# Patient Record
Sex: Female | Born: 1950 | Race: Black or African American | Hispanic: No | Marital: Single | Smoking: Never smoker
Health system: Southern US, Community
[De-identification: ages and names within clinical notes are randomized; demographics above are authoritative.]

## PROBLEM LIST (undated history)

## (undated) DIAGNOSIS — E7800 Pure hypercholesterolemia, unspecified: Secondary | ICD-10-CM

## (undated) DIAGNOSIS — I10 Essential (primary) hypertension: Secondary | ICD-10-CM

## (undated) DIAGNOSIS — E119 Type 2 diabetes mellitus without complications: Secondary | ICD-10-CM

## (undated) DIAGNOSIS — N289 Disorder of kidney and ureter, unspecified: Secondary | ICD-10-CM

## (undated) HISTORY — PX: TUBAL LIGATION: SHX77

---

## 2004-03-25 ENCOUNTER — Emergency Department: Payer: Self-pay | Admitting: Unknown Physician Specialty

## 2005-03-16 ENCOUNTER — Emergency Department: Payer: Self-pay | Admitting: General Practice

## 2006-02-10 ENCOUNTER — Emergency Department: Payer: Self-pay | Admitting: General Practice

## 2007-09-18 ENCOUNTER — Emergency Department: Payer: Self-pay | Admitting: Emergency Medicine

## 2008-09-07 ENCOUNTER — Emergency Department: Payer: Self-pay | Admitting: Emergency Medicine

## 2009-01-25 ENCOUNTER — Emergency Department: Payer: Self-pay | Admitting: Emergency Medicine

## 2010-07-22 ENCOUNTER — Emergency Department: Payer: Self-pay | Admitting: Emergency Medicine

## 2013-11-20 ENCOUNTER — Emergency Department: Payer: Self-pay | Admitting: Emergency Medicine

## 2014-11-30 ENCOUNTER — Emergency Department
Admission: EM | Admit: 2014-11-30 | Discharge: 2014-11-30 | Disposition: A | Discharge status: Home / Self Care | Payer: BC Managed Care – PPO | Attending: Emergency Medicine | Admitting: Emergency Medicine

## 2014-11-30 ENCOUNTER — Encounter: Payer: Self-pay | Admitting: Emergency Medicine

## 2014-11-30 ENCOUNTER — Emergency Department: Payer: BC Managed Care – PPO

## 2014-11-30 DIAGNOSIS — N3091 Cystitis, unspecified with hematuria: Secondary | ICD-10-CM | POA: Insufficient documentation

## 2014-11-30 DIAGNOSIS — R1030 Lower abdominal pain, unspecified: Secondary | ICD-10-CM | POA: Diagnosis present

## 2014-11-30 DIAGNOSIS — R319 Hematuria, unspecified: Secondary | ICD-10-CM

## 2014-11-30 HISTORY — DX: Pure hypercholesterolemia, unspecified: E78.00

## 2014-11-30 LAB — URINALYSIS COMPLETE WITH MICROSCOPIC (ARMC ONLY)
BILIRUBIN URINE: NEGATIVE
Glucose, UA: NEGATIVE mg/dL
KETONES UR: NEGATIVE mg/dL
LEUKOCYTES UA: NEGATIVE
NITRITE: NEGATIVE
PH: 7 (ref 5.0–8.0)
Protein, ur: NEGATIVE mg/dL
SPECIFIC GRAVITY, URINE: 1.006 (ref 1.005–1.030)

## 2014-11-30 LAB — CBC WITH DIFFERENTIAL/PLATELET
BASOS PCT: 1 %
Basophils Absolute: 0 10*3/uL (ref 0–0.1)
EOS ABS: 0.1 10*3/uL (ref 0–0.7)
EOS PCT: 2 %
HCT: 39.4 % (ref 35.0–47.0)
Hemoglobin: 13.2 g/dL (ref 12.0–16.0)
LYMPHS ABS: 1.4 10*3/uL (ref 1.0–3.6)
Lymphocytes Relative: 27 %
MCH: 30.1 pg (ref 26.0–34.0)
MCHC: 33.4 g/dL (ref 32.0–36.0)
MCV: 90.1 fL (ref 80.0–100.0)
MONO ABS: 0.5 10*3/uL (ref 0.2–0.9)
MONOS PCT: 10 %
Neutro Abs: 3 10*3/uL (ref 1.4–6.5)
Neutrophils Relative %: 60 %
Platelets: 244 10*3/uL (ref 150–440)
RBC: 4.38 MIL/uL (ref 3.80–5.20)
RDW: 13.9 % (ref 11.5–14.5)
WBC: 5 10*3/uL (ref 3.6–11.0)

## 2014-11-30 LAB — BASIC METABOLIC PANEL
Anion gap: 7 (ref 5–15)
BUN: 11 mg/dL (ref 6–20)
CALCIUM: 9.5 mg/dL (ref 8.9–10.3)
CHLORIDE: 106 mmol/L (ref 101–111)
CO2: 27 mmol/L (ref 22–32)
CREATININE: 0.79 mg/dL (ref 0.44–1.00)
GFR calc non Af Amer: >60 mL/min (ref >60)
GLUCOSE: 96 mg/dL (ref 65–99)
Potassium: 3.6 mmol/L (ref 3.5–5.1)
Sodium: 140 mmol/L (ref 135–145)

## 2014-11-30 MED ORDER — NITROFURANTOIN MONOHYD MACRO 100 MG PO CAPS: 100.0000 mg | ORAL_CAPSULE | Freq: Two times a day (BID) | ORAL | Status: AC

## 2014-11-30 NOTE — ED Provider Notes (Signed)
Time Seen: Approximately 1600  I have reviewed the triage notes  Chief Complaint: Abdominal Cramping   History of Present Illness: Carolyn Clark is a 64 y.o. female who presents with some lower abdominal cramping. She states the cramping and lower abdominal pain started late Friday and then gradually started radiating into both flank areas. Right side is worse than the left. She noticed some blood in her urine and does have some urinary frequency. She had some brief nausea but no consistent symptoms and denies any vomiting. Denies any melena or hematochezia is currently not on any anticoagulation therapy.   Past Medical History  Diagnosis Date  . Hypercholesteremia     There are no active problems to display for this patient.   Past Surgical History  Procedure Laterality Date  . Tubal ligation      Past Surgical History  Procedure Laterality Date  . Tubal ligation      Current Outpatient Rx  Name  Route  Sig  Dispense  Refill  . nitrofurantoin, macrocrystal-monohydrate, (MACROBID) 100 MG capsule   Oral   Take 1 capsule (100 mg total) by mouth 2 (two) times daily.   14 capsule   0     Allergies:  Review of patient's allergies indicates no known allergies.  Family History: No family history on file.  Social History: Social History  Substance Use Topics  . Smoking status: Never Smoker   . Smokeless tobacco: None  . Alcohol Use: No     Review of Systems:   10 point review of systems was performed and was otherwise negative:  Constitutional: No fever Eyes: No visual disturbances ENT: No sore throat, ear pain Cardiac: No chest pain Respiratory: No shortness of breath, wheezing, or stridor Abdomen: No abdominal pain, no vomiting, No diarrhea Endocrine: No weight loss, No night sweats Extremities: No peripheral edema, cyanosis Skin: No rashes, easy bruising Neurologic: No focal weakness, trouble with speech or swollowing Urologic: Mild burning with  urination and some mild hematuria.  Physical Exam:  ED Triage Vitals  Enc Vitals Group     BP 11/30/14 1351 170/81 mmHg     Pulse Rate 11/30/14 1351 82     Resp 11/30/14 1351 20     Temp 11/30/14 1351 98 F (36.7 C)     Temp Source 11/30/14 1351 Oral     SpO2 11/30/14 1351 98 %     Weight 11/30/14 1351 160 lb (72.576 kg)     Height 11/30/14 1351  (1.626 m)     Head Cir --      Peak Flow --      Pain Score 11/30/14 1353 8     Pain Loc --      Pain Edu? --      Excl. in GC? --     General: Awake , Alert , and Oriented times 3; GCS 15 Head: Normal cephalic , atraumatic Eyes: Pupils equal , round, reactive to light Nose/Throat: No nasal drainage, patent upper airway without erythema or exudate.  Neck: Supple, Full range of motion, No anterior adenopathy or palpable thyroid masses Lungs: Clear to ascultation without wheezes , rhonchi, or rales Heart: Regular rate, regular rhythm without murmurs , gallops , or rubs Abdomen: Soft, non tender without rebound, guarding , or rigidity; bowel sounds positive and symmetric in all 4 quadrants. No organomegaly .        Extremities: 2 plus symmetric pulses. No edema, clubbing or cyanosis Neurologic: normal ambulation, Motor  symmetric without deficits, sensory intact Skin: warm, dry, no rashes   Labs:   All laboratory work was reviewed including any pertinent negatives or positives listed below:  Labs Reviewed  URINALYSIS COMPLETEWITH MICROSCOPIC ([facility] ONLY) - Abnormal; Notable for the following:    Color, Urine STRAW (*)    APPearance CLEAR (*)    Hgb urine dipstick 3+ (*)    Bacteria, UA RARE (*)    Squamous Epithelial / LPF 0-5 (*)    All other components within normal limits  URINE CULTURE  BASIC METABOLIC PANEL  CBC WITH DIFFERENTIAL/PLATELET   review of laboratory work is significant for blood in urine. Urine culture is pending   Radiology:  EXAM: CT ABDOMEN AND PELVIS WITHOUT CONTRAST  TECHNIQUE: Multidetector CT  imaging of the abdomen and pelvis was performed following the standard protocol without IV contrast.  COMPARISON: None.  FINDINGS: The lung bases are clear. No pleural or pericardial effusion.  A 0.8 cm in diameter nonobstructing stone is identified in the upper pole of the right kidney. No other urinary tract stones are present. There is no hydronephrosis on the right or left. Focus of low attenuation in the upper pole of the right kidney is compatible with a cyst. The kidneys are otherwise unremarkable. Uterus, adnexa and urinary bladder appear normal.  The gallbladder, spleen, adrenal glands and pancreas appear normal. A few small scattered low attenuating lesions in the liver are most consistent with cysts. Fat containing umbilical hernia is identified. The stomach, small and large bowel and appendix appear normal.  No lytic or sclerotic bony lesion is identified. Lower lumbar facet arthropathy is seen.  IMPRESSION: 0.7 cm nonobstructing stone upper pole right kidney. Negative for hydronephrosis or ureteral stone. No acute abnormality.   I personally reviewed the radiologic studies      ED Course:  Patient's stay here was uneventful and I felt her differential likely bubble down to cystitis, urinary tract infection, renal colic. She does have a small kidney stone on her CAT scan does not appear to be causing any symptoms at this point. Patient is afebrile and has a urine culture pending and I felt we could discharge her on oral antibiotic therapy. The pressure was elevated during today's visit which I thought may be cysts situational hypertension and she was advised to follow up with her primary physician.    Assessment:  Hemorrhagic cystitis   Final Clinical Impression: Final diagnoses:  Hematuria     Plan:  Outpatient management Patient was advised to return immediately if condition worsens. Patient was advised to follow up with her primary care physician  or other specialized physicians involved and in their current assessment.            Jennye Moccasin, MD 11/30/14 [phone]

## 2014-11-30 NOTE — ED Notes (Signed)
Lower abd cramping yesterday. Then noticed lower abck pain with blood in urine this am

## 2014-11-30 NOTE — Discharge Instructions (Signed)
Hematuria Hematuria is blood in your urine. It can be caused by a bladder infection, kidney infection, prostate infection, kidney stone, or cancer of your urinary tract. Infections can usually be treated with medicine, and a kidney stone usually will pass through your urine. If neither of these is the cause of your hematuria, further workup to find out the reason may be needed. It is very important that you tell your health care provider about any blood you see in your urine, even if the blood stops without treatment or happens without causing pain. Blood in your urine that happens and then stops and then happens again can be a symptom of a very serious condition. Also, pain is not a symptom in the initial stages of many urinary cancers. HOME CARE INSTRUCTIONS   Drink lots of fluid, 3-4 quarts a day. If you have been diagnosed with an infection, cranberry juice is especially recommended, in addition to large amounts of water.  Avoid caffeine, tea, and carbonated beverages because they tend to irritate the bladder.  Avoid alcohol because it may irritate the prostate.  Take all medicines as directed by your health care provider.  If you were prescribed an antibiotic medicine, finish it all even if you start to feel better.  If you have been diagnosed with a kidney stone, follow your health care provider's instructions regarding straining your urine to catch the stone.  Empty your bladder often. Avoid holding urine for long periods of time.  After a bowel movement, women should cleanse front to back. Use each tissue only once.  Empty your bladder before and after sexual intercourse if you are a female. SEEK MEDICAL CARE IF:  You develop back pain.  You have a fever.  You have a feeling of sickness in your stomach (nausea) or vomiting.  Your symptoms are not better in 3 days. Return sooner if you are getting worse. SEEK IMMEDIATE MEDICAL CARE IF:   You develop severe vomiting and are  unable to keep the medicine down.  You develop severe back or abdominal pain despite taking your medicines.  You begin passing a large amount of blood or clots in your urine.  You feel extremely weak or faint, or you pass out. MAKE SURE YOU:   Understand these instructions.  Will watch your condition.  Will get help right away if you are not doing well or get worse. Document Released: 02/21/2005 Document Revised: 07/08/2013 Document Reviewed: 10/22/2012 [facility] Patient Information 2015 [place], [place]. This information is not intended to replace advice given to you by your health care provider. Make sure you discuss any questions you have with your health care provider.  Please return immediately if condition worsens. Please contact her primary physician or the physician you were given for referral. If you have any specialist physicians involved in her treatment and plan please also contact them. Thank you for using [facility]. Return especially for fever, persistent vomiting, or any other new concerns.

## 2014-11-30 NOTE — ED Notes (Signed)
Patient transported to CT 

## 2015-11-24 ENCOUNTER — Encounter: Payer: Self-pay | Admitting: Emergency Medicine

## 2015-11-24 DIAGNOSIS — Y939 Activity, unspecified: Secondary | ICD-10-CM | POA: Insufficient documentation

## 2015-11-24 DIAGNOSIS — X58XXXA Exposure to other specified factors, initial encounter: Secondary | ICD-10-CM | POA: Diagnosis not present

## 2015-11-24 DIAGNOSIS — S46912A Strain of unspecified muscle, fascia and tendon at shoulder and upper arm level, left arm, initial encounter: Secondary | ICD-10-CM | POA: Insufficient documentation

## 2015-11-24 DIAGNOSIS — R079 Chest pain, unspecified: Secondary | ICD-10-CM | POA: Insufficient documentation

## 2015-11-24 DIAGNOSIS — Y999 Unspecified external cause status: Secondary | ICD-10-CM | POA: Diagnosis not present

## 2015-11-24 DIAGNOSIS — Y929 Unspecified place or not applicable: Secondary | ICD-10-CM | POA: Diagnosis not present

## 2015-11-24 DIAGNOSIS — M79602 Pain in left arm: Secondary | ICD-10-CM | POA: Diagnosis present

## 2015-11-24 LAB — CBC
HEMATOCRIT: 39.6 % (ref 35.0–47.0)
HEMOGLOBIN: 13.4 g/dL (ref 12.0–16.0)
MCH: 30.1 pg (ref 26.0–34.0)
MCHC: 33.8 g/dL (ref 32.0–36.0)
MCV: 89.2 fL (ref 80.0–100.0)
Platelets: 251 10*3/uL (ref 150–440)
RBC: 4.44 MIL/uL (ref 3.80–5.20)
RDW: 14.2 % (ref 11.5–14.5)
WBC: 4.7 10*3/uL (ref 3.6–11.0)

## 2015-11-24 LAB — BASIC METABOLIC PANEL
ANION GAP: 8 (ref 5–15)
BUN: 14 mg/dL (ref 6–20)
CO2: 25 mmol/L (ref 22–32)
Calcium: 9.7 mg/dL (ref 8.9–10.3)
Chloride: 106 mmol/L (ref 101–111)
Creatinine, Ser: 0.86 mg/dL (ref 0.44–1.00)
GFR calc Af Amer: >60 mL/min (ref >60)
GLUCOSE: 108 mg/dL — AB (ref 65–99)
POTASSIUM: 3.6 mmol/L (ref 3.5–5.1)
SODIUM: 139 mmol/L (ref 135–145)

## 2015-11-24 LAB — TROPONIN I

## 2015-11-24 NOTE — ED Triage Notes (Addendum)
Patient ambulatory to triage with steady gait, without difficulty or distress noted; pt reports pain to left arm today that increases with movement; pt denies any specific injury but is indicates area of left upper chest; denies any accomp symptoms but admits to having indigestion earlier today

## 2015-11-25 ENCOUNTER — Emergency Department: Payer: BC Managed Care – PPO

## 2015-11-25 ENCOUNTER — Emergency Department
Admission: EM | Admit: 2015-11-25 | Discharge: 2015-11-25 | Disposition: A | Discharge status: Home / Self Care | Payer: BC Managed Care – PPO | Attending: Student | Admitting: Student

## 2015-11-25 DIAGNOSIS — S46912A Strain of unspecified muscle, fascia and tendon at shoulder and upper arm level, left arm, initial encounter: Secondary | ICD-10-CM

## 2015-11-25 MED ORDER — KETOROLAC TROMETHAMINE 30 MG/ML IJ SOLN
15.0000 mg | Freq: Once | INTRAMUSCULAR | Status: AC
  Administered 2015-11-25: 15 mg via INTRAMUSCULAR
  Filled 2015-11-25: qty 1

## 2015-11-25 MED ORDER — NAPROXEN 500 MG PO TABS: 500.0000 mg | ORAL_TABLET | Freq: Two times a day (BID) | ORAL | 0 refills | Status: AC

## 2015-11-25 MED ORDER — CYCLOBENZAPRINE HCL 10 MG PO TABS
5.0000 mg | ORAL_TABLET | Freq: Once | ORAL | Status: AC
Start: 2015-11-25 — End: 2015-11-25
  Administered 2015-11-25: 5 mg via ORAL
  Filled 2015-11-25: qty 1

## 2015-11-25 MED ORDER — CYCLOBENZAPRINE HCL 5 MG PO TABS: 5.0000 mg | ORAL_TABLET | Freq: Three times a day (TID) | ORAL | 0 refills | Status: AC | PRN

## 2015-11-25 NOTE — ED Notes (Signed)
Pt discharged to home.  Family member driving.  Discharge instructions reviewed.  Verbalized understanding.  No questions or concerns at this time.  Teach back verified.  Pt in NAD.  No items left in ED.   

## 2015-11-25 NOTE — ED Provider Notes (Signed)
[facility] Emergency Department Provider Note   ____________________________________________   First MD Initiated Contact with Patient 11/25/15 [phone]     (approximate)  I have reviewed the triage vital signs and the nursing notes.   HISTORY  Chief Complaint Arm Pain    HPI PANAGIOTA PERFETTI is a 65 y.o. female with history of hypercholesterolemia who presents for evaluation of left shoulder pain which became severe at approximately 5 or 6 PM this evening, gradual onset, constant, moderate, worse with movement. Patient reports that throughout the day today she has had some pain in the left shoulder worse with movement, and has been constant but has been constant and severe since 5 or 6 PM. Pain does radiate into the left upper chest just adjacent to the shoulder. Pain is not worse with exertion or deep inspiration, not associated with any shortness of breath, nausea, sudden sweating, does not radiate to the back or down towards the feet, is not ripping or tearing in nature. 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 She had some mild indigestion earlier today which she reports is not uncommon. She denies any history of coronary artery disease, PE, aortic dissection or aortic aneurysm.   Past Medical History:  Diagnosis Date  . Hypercholesteremia     There are no active problems to display for this patient.   Past Surgical History:  Procedure Laterality Date  . TUBAL LIGATION      Prior to Admission medications   Not on File    Allergies Review of patient's allergies indicates no known allergies.  No family history on file.  Social History Social History  Substance Use Topics  . Smoking status: Never Smoker  . Smokeless tobacco: Never Used  . Alcohol use No    Review of Systems Constitutional: No fever/chills Eyes: No visual changes. ENT: No sore  throat. Cardiovascular: + chest pain. Respiratory: Denies shortness of breath. Gastrointestinal: No abdominal pain.  No nausea, no vomiting.  No diarrhea.  No constipation. Genitourinary: Negative for dysuria. Musculoskeletal: Negative for back pain. Skin: Negative for rash. Neurological: Negative for headaches, focal weakness or numbness.  10-point ROS otherwise negative.  ____________________________________________   PHYSICAL EXAM:  VITAL SIGNS: ED Triage Vitals  Enc Vitals Group     BP 11/24/15 2225 (!) 144/71     Pulse Rate 11/24/15 2225 80     Resp 11/24/15 2225 18     Temp 11/24/15 2225 97.8 F (36.6 C)     Temp Source 11/24/15 2225 Oral     SpO2 11/24/15 2225 99 %     Weight 11/24/15 2225 160 lb (72.6 kg)     Height 11/24/15 2225 5\' 4"  (1.626 m)     Head Circumference --      Peak Flow --      Pain Score 11/24/15 2229 7     Pain Loc --      Pain Edu? --      Excl. in GC? --     Constitutional: Alert and oriented. Well appearing and in no acute distress. Eyes: Conjunctivae are normal. PERRL. EOMI. Head: Atraumatic. Nose: No congestion/rhinnorhea. Mouth/Throat: Mucous membranes are moist.  Oropharynx non-erythematous. Neck: No stridor. Supple without meningismus. Cardiovascular: Normal rate, regular rhythm. Grossly normal heart sounds.  Good peripheral circulation. Respiratory: Normal respiratory effort.  No retractions. Lungs CTAB. Gastrointestinal: Soft and nontender. No distention. No CVA tenderness.  Genitourinary: deferred Musculoskeletal: No lower extremity tenderness nor edema.  No joint effusions. Tenderness to palpation throughout the left anterior shoulder without bony step-off or deformity, full although painful range of motion in the left shoulder.No swelling throughout the left arm. 2+ left radial pulse, left radial median and ulnar nerve are intact. Neurologic:  Normal speech and language. No gross focal neurologic deficits are appreciated. No gait  instability. Skin:  Skin is warm, dry and intact. No rash noted. Psychiatric: Mood and affect are normal. Speech and behavior are normal.  ____________________________________________   LABS (all labs ordered are listed, but only abnormal results are displayed)  Labs Reviewed  BASIC METABOLIC PANEL - Abnormal; Notable for the following:       Result Value   Glucose, Bld 108 (*)    All other components within normal limits  CBC  TROPONIN I   ____________________________________________  EKG  ED ECG REPORT I, Gayla Doss, the attending physician, personally viewed and interpreted this ECG.   Date: 11/25/2015  EKG Time: 22:38  Rate: 79  Rhythm: normal sinus rhythm  Axis: normal  Intervals:none  ST&T Change: No acute ST elevation or acute ST depression. T-wave inversions in lead V4, V5, V6.  ____________________________________________  RADIOLOGY  CXR IMPRESSION:  No acute cardiopulmonary process seen.       Left shoulder xray IMPRESSION:  No evidence of fracture or dislocation.     ____________________________________________   PROCEDURES  Procedure(s) performed: None  Procedures  Critical Care performed: No  ____________________________________________   INITIAL IMPRESSION / ASSESSMENT AND PLAN / ED COURSE  Pertinent labs & imaging results that were available during my care of the patient were reviewed by me and considered in my medical decision making (see chart for details).  NAUTIKA CRESSEY is a 65 y.o. female with history of hypercholesterolemia who presents for evaluation of left shoulder pain which became severe at approximately 5 or 6 PM this evening that has radiated into the adjacent left chest. On exam, she is very well-appearing and in no acute distress. Vital signs are stable and she is afebrile. She has reproducible point tenderness without bony deformity in the left shoulder and I suspect she has a shoulder strain. She is  neurovascularly intact in the left arm. I doubt that this represents ACS. Suspect the pain in her chest is actually referred pain from the shoulder. Her EKG shows no evidence of acute ischemia. There are T-wave inversions in V4, V5 and V6 however no prior EKGs available for comparison/to establish chronicity. Troponin, drawn more than 4.5 hours after the onset of her pain, which has been constant, is negative and I do not think this represents atypical ACS. Clinical picture is not consistent with PE or acute aortic dissection. CBC and BMP unremarkable. We'll treat her pain, obtain screening chest x-ray and left shoulder film but if unremarkable, I anticipate that she can be discharged with anti-inflammatory medications and muscle relaxant.  ----------------------------------------- 2:23 AM on 11/25/2015 ----------------------------------------- Plain film negative. Patient continues to rest comfortably. We discussed meticulous return precautions and need for close PCP follow-up and she is comfortable with the discharge plan. DC home.     Clinical Course     ____________________________________________   FINAL CLINICAL IMPRESSION(S) / ED DIAGNOSES  Final diagnoses:  Left shoulder strain, initial encounter      NEW MEDICATIONS STARTED DURING THIS VISIT:  New Prescriptions   No medications on file     Note:  This document was prepared using  [employer] engine[employer]oice recognition software and may include unintentional dictation errors.    Gayla DossEryka A Ramiya Delahunty, MD 11/25/15 [phone]

## 2016-07-19 ENCOUNTER — Emergency Department
Admission: EM | Admit: 2016-07-19 | Discharge: 2016-07-19 | Disposition: A | Discharge status: Home / Self Care | Payer: BC Managed Care – PPO | Attending: Emergency Medicine | Admitting: Emergency Medicine

## 2016-07-19 ENCOUNTER — Encounter: Payer: Self-pay | Admitting: Emergency Medicine

## 2016-07-19 DIAGNOSIS — N200 Calculus of kidney: Secondary | ICD-10-CM

## 2016-07-19 DIAGNOSIS — R300 Dysuria: Secondary | ICD-10-CM | POA: Diagnosis present

## 2016-07-19 HISTORY — DX: Disorder of kidney and ureter, unspecified: N28.9

## 2016-07-19 LAB — URINALYSIS, COMPLETE (UACMP) WITH MICROSCOPIC
BACTERIA UA: NONE SEEN
Bilirubin Urine: NEGATIVE
Glucose, UA: NEGATIVE mg/dL
Ketones, ur: NEGATIVE mg/dL
Leukocytes, UA: NEGATIVE
NITRITE: NEGATIVE
PROTEIN: NEGATIVE mg/dL
Specific Gravity, Urine: 1.011 (ref 1.005–1.030)
WBC UA: NONE SEEN WBC/hpf (ref 0–5)
pH: 6 (ref 5.0–8.0)

## 2016-07-19 MED ORDER — KETOROLAC TROMETHAMINE 10 MG PO TABS: 10.0000 mg | ORAL_TABLET | Freq: Four times a day (QID) | ORAL | 0 refills | Status: DC | PRN

## 2016-07-19 MED ORDER — KETOROLAC TROMETHAMINE 30 MG/ML IJ SOLN
30.0000 mg | Freq: Once | INTRAMUSCULAR | Status: AC
  Administered 2016-07-19: 30 mg via INTRAMUSCULAR
  Filled 2016-07-19: qty 1

## 2016-07-19 MED ORDER — TAMSULOSIN HCL 0.4 MG PO CAPS: 0.4000 mg | ORAL_CAPSULE | Freq: Every day | ORAL | 1 refills | Status: DC

## 2016-07-19 MED ORDER — PHENAZOPYRIDINE HCL 95 MG PO TABS: 95.0000 mg | ORAL_TABLET | Freq: Three times a day (TID) | ORAL | 0 refills | Status: DC | PRN

## 2016-07-19 NOTE — ED Provider Notes (Signed)
[facility] Emergency Department Provider Note  ____________________________________________  Time seen: Approximately 5:31 PM  I have reviewed the triage vital signs and the nursing notes.   HISTORY  Chief Complaint Dysuria    HPI Carolyn Clark is a 66 y.o. female who presents to emergency department complaining of bilateral flank pain with dysuria. Patient reports that she has noticed some burning sensation to bilateral flanks. This has been ongoing times several days. Over the last 1-2 days, she has developed dysuria. She denies any hematuria, but does endorse some polyuria. Patient does have a history of kidney stones but states that the pain and symptoms are different than previous kidney stone. She does report a history of UTI and states symptoms are similar to same. Patient denies any headache, visual changes, fever or chills, abdominal pain, nausea or vomiting, diarrhea or constipation. No medications for this complaint prior to arrival. No other complaints at this time.   Past Medical History:  Diagnosis Date  . Hypercholesteremia   . Renal disorder    kidney stone    There are no active problems to display for this patient.   Past Surgical History:  Procedure Laterality Date  . TUBAL LIGATION      Prior to Admission medications   Medication Sig Start Date End Date Taking? Authorizing Provider  cyclobenzaprine (FLEXERIL) 5 MG tablet Take 1 tablet (5 mg total) by mouth 3 (three) times daily as needed for muscle spasms. Do not drive while taking this medication. 11/25/15   Gayla Doss, MD  ketorolac (TORADOL) 10 MG tablet Take 1 tablet (10 mg total) by mouth every 6 (six) hours as needed. 07/19/16   Fay Bagg, Delorise Royals, PA-C  phenazopyridine (PYRIDIUM) 95 MG tablet Take 1 tablet (95 mg total) by mouth 3 (three) times daily as needed for pain. 07/19/16   Lonza Shimabukuro, Delorise Royals, PA-C  tamsulosin (FLOMAX) 0.4 MG CAPS capsule Take 1 capsule (0.4 mg  total) by mouth daily. Take daily until symptoms resolve. 07/19/16   Kelina Beauchamp, Delorise Royals, PA-C    Allergies Patient has no known allergies.  No family history on file.  Social History Social History  Substance Use Topics  . Smoking status: Never Smoker  . Smokeless tobacco: Never Used  . Alcohol use No     Review of Systems  Constitutional: No fever/chills Eyes: No visual changes. No discharge ENT: No upper respiratory complaints. Cardiovascular: no chest pain. Respiratory: no cough. No SOB. Gastrointestinal: No abdominal pain.  No nausea, no vomiting.  No diarrhea.  No constipation. Genitourinary:positive for bilateral flank pain. Positive for dysuria and polyuria. No hematuria. Musculoskeletal: Negative for musculoskeletal pain. Skin: Negative for rash, abrasions, lacerations, ecchymosis. Neurological: Negative for headaches, focal weakness or numbness. 10-point ROS otherwise negative.  ____________________________________________   PHYSICAL EXAM:  VITAL SIGNS: ED Triage Vitals  Enc Vitals Group     BP 07/19/16 1710 (!) 164/73     Pulse Rate 07/19/16 1710 90     Resp 07/19/16 1710 16     Temp 07/19/16 1710 98.7 F (37.1 C)     Temp Source 07/19/16 1710 Oral     SpO2 07/19/16 1710 100 %     Weight 07/19/16 1708 160 lb (72.6 kg)     Height 07/19/16 1708 5\' 4"  (1.626 m)     Head Circumference --      Peak Flow --      Pain Score 07/19/16 1707 6     Pain Loc --  Pain Edu? --      Excl. in GC? --      Constitutional: Alert and oriented. Well appearing and in no acute distress. Eyes: Conjunctivae are normal. PERRL. EOMI. Head: Atraumatic. ENT:      Ears:       Nose: No congestion/rhinnorhea.      Mouth/Throat: Mucous membranes are moist.  Neck: No stridor.   Hematological/Lymphatic/Immunilogical: No cervical lymphadenopathy. Cardiovascular: Normal rate, regular rhythm. Normal S1 and S2.  Good peripheral circulation. Respiratory: Normal respiratory  effort without tachypnea or retractions. Lungs CTAB. Good air entry to the bases with no decreased or absent breath sounds. Gastrointestinal: Bowel sounds 4 quadrants. Soft and nontender to palpation. No guarding or rigidity. No palpable masses. No distention. No CVA tenderness. Musculoskeletal: Full range of motion to all extremities. No gross deformities appreciated. Neurologic:  Normal speech and language. No gross focal neurologic deficits are appreciated.  Skin:  Skin is warm, dry and intact. No rash noted. Psychiatric: Mood and affect are normal. Speech and behavior are normal. Patient exhibits appropriate insight and judgement.   ____________________________________________   LABS (all labs ordered are listed, but only abnormal results are displayed)  Labs Reviewed  URINALYSIS, COMPLETE (UACMP) WITH MICROSCOPIC - Abnormal; Notable for the following:       Result Value   Color, Urine YELLOW (*)    APPearance CLEAR (*)    Hgb urine dipstick MODERATE (*)    Squamous Epithelial / LPF 0-5 (*)    All other components within normal limits   ____________________________________________  EKG   ____________________________________________  RADIOLOGY   No results found.  ____________________________________________    PROCEDURES  Procedure(s) performed:    Procedures    Medications  ketorolac (TORADOL) 30 MG/ML injection 30 mg (not administered)     ____________________________________________   INITIAL IMPRESSION / ASSESSMENT AND PLAN / ED COURSE  Pertinent labs & imaging results that were available during my care of the patient were reviewed by me and considered in my medical decision making (see chart for details).  Review of the [place] CSRS was performed in accordance of the [facility] prior to dispensing any controlled drugs.     Patient's diagnosis is consistent with kidney stone. Patient presents emergency department complaining of bilateral flank pain.  Initially, patient was endorsing equal sensation and pain bilaterally. After discussion, patient did endorse slightly greater symptoms on right and left. Patient reports that she has had UTIs in the past and symptoms were consistent with UTI, however urinalysis returns with no indication of infection with no nitrates or white blood cells.patient is not symptomatic for concern of other infectious etiology. Patient does report a history of kidney stone approximately 2 years ago. Patient's urinalysis was positive for hematuria at that time which is consistent with today's urinalysis. Patient's symptoms are consistent with kidney stone. Previous kidney stone measured 0.8 cm in diameter in past with no occasions. Patient does drink soda on a daily basis with limited other fluid intake. I feel likely that patient has calcium oxalate stones from consistent soda intake. At this time, I discussed imaging with ultrasound versus CT but do not filling patient has hydronephrosis or warrants further imaging. Patient agrees at this time and request treatment for kidney stone. Patient is given concerning symptoms to include fevers and chills, increasing flank pain, increasing hematuria, or any other concerning symptoms to return to the emergency department or follow-up with her primary care provider. Patient is given shot of Toradol emergency department. She will  be discharged with Pyridium, Flomax, oral Toradol.. Patient will follow up primary care as needed. Patient is given ED precautions to return to the ED for any worsening or new symptoms.     ____________________________________________  FINAL CLINICAL IMPRESSION(S) / ED DIAGNOSES  Final diagnoses:  Kidney stone      NEW MEDICATIONS STARTED DURING THIS VISIT:  New Prescriptions   KETOROLAC (TORADOL) 10 MG TABLET    Take 1 tablet (10 mg total) by mouth every 6 (six) hours as needed.   PHENAZOPYRIDINE (PYRIDIUM) 95 MG TABLET    Take 1 tablet (95 mg total) by  mouth 3 (three) times daily as needed for pain.   TAMSULOSIN (FLOMAX) 0.4 MG CAPS CAPSULE    Take 1 capsule (0.4 mg total) by mouth daily. Take daily until symptoms resolve.        This chart was dictated using voice recognition software/Dragon. Despite best efforts to proofread, errors can occur which can change the meaning. Any change was purely unintentional.    Racheal PatchesCuthriell, Tresha Muzio D, PA-C 07/19/16 Cloyd [id number]    Goodman, Graydon, MD 07/19/16 [phone]

## 2016-07-19 NOTE — ED Notes (Signed)
See triage note having lower back pain and some dysuria afebrile on arrival

## 2016-07-19 NOTE — ED Triage Notes (Signed)
Low back pain and burning with urination and frequency x 2 days.

## 2016-11-27 IMAGING — CT CT RENAL STONE PROTOCOL
3 of 4 series · 7 of 46 positions shown, 13 images · non-contrast
Comparison: None.

CLINICAL DATA: Lower abdominal and pelvic pain and cramping which
began 11/29/2014. Hematuria today.

EXAM:
CT ABDOMEN AND PELVIS WITHOUT CONTRAST
TECHNIQUE: Multidetector CT imaging of the abdomen and pelvis was performed
following the standard protocol without IV contrast.

[Series 4: lung · axial · 0.71mm/px · z∈[-744,-704]mm · 3 of 17 slices shown, 7 images]
[im 5/17  soft-tissue]
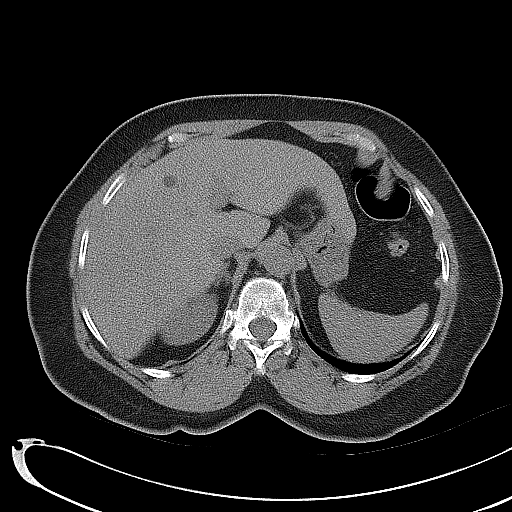
[im 5/17  lung]
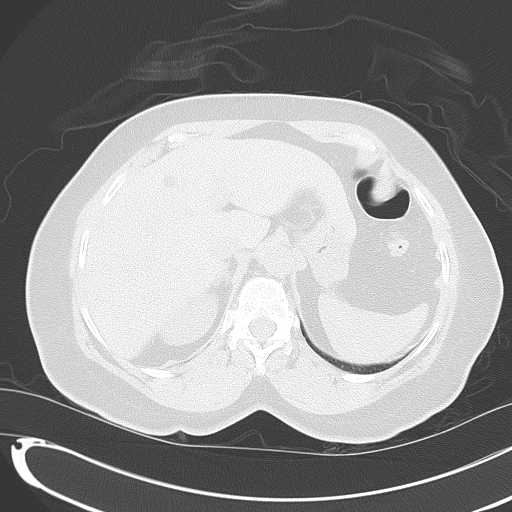
[im 5/17  bone]
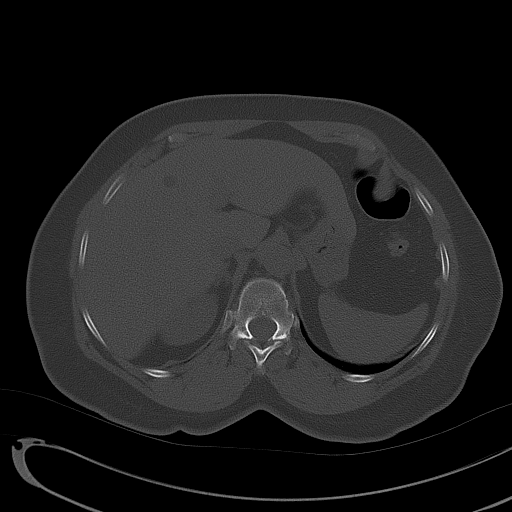
[im 9/17  soft-tissue]
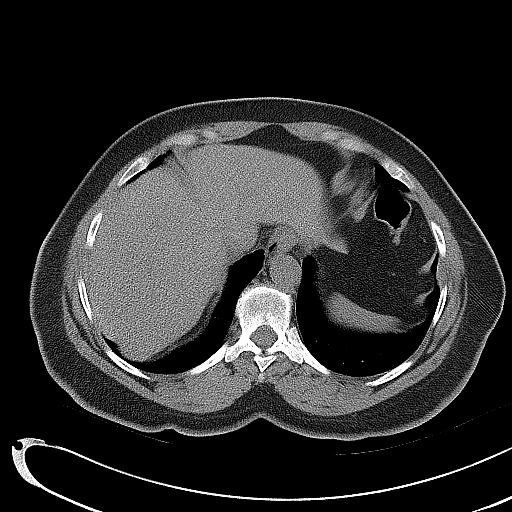
[im 9/17  lung]
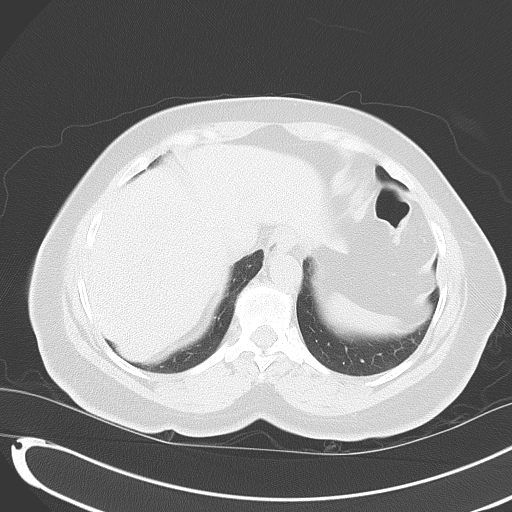
[im 13/17  soft-tissue]
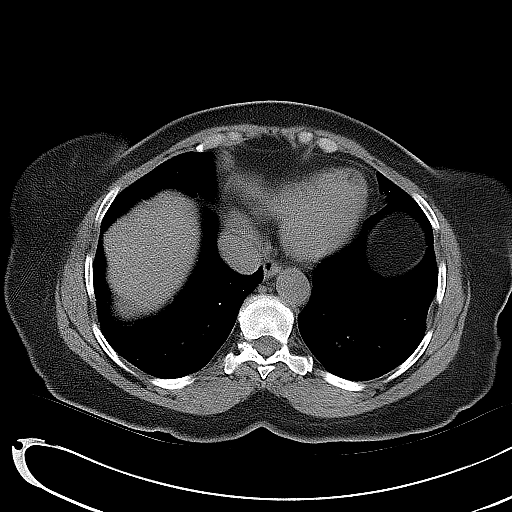
[im 13/17  lung]
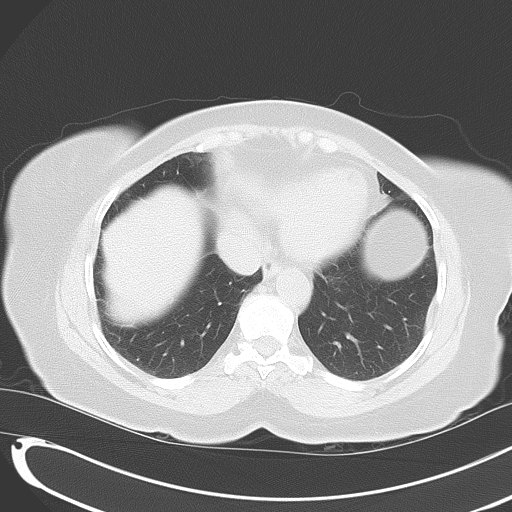

[Series 5: coronal · coronal · 0.74mm/px · 3 of 123 slices shown, 4 images]
[im 41/123  soft-tissue]
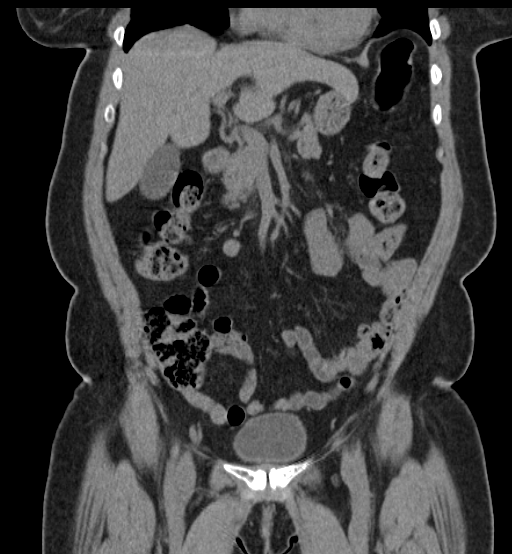
[im 55/123  soft-tissue]
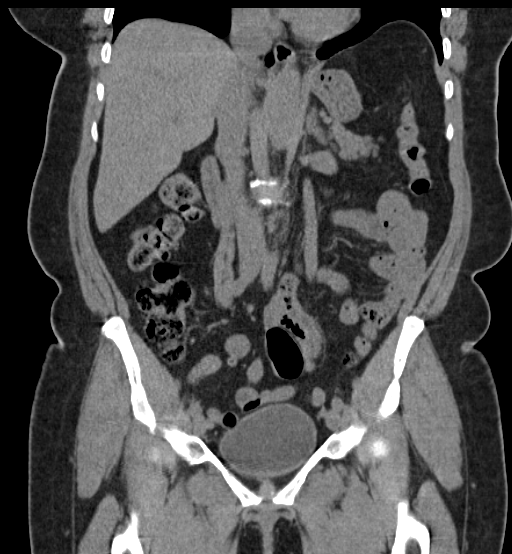
[im 55/123  bone]
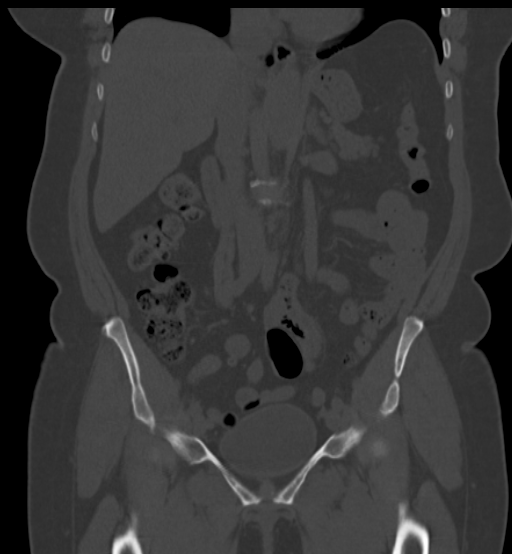
[im 68/123  soft-tissue]
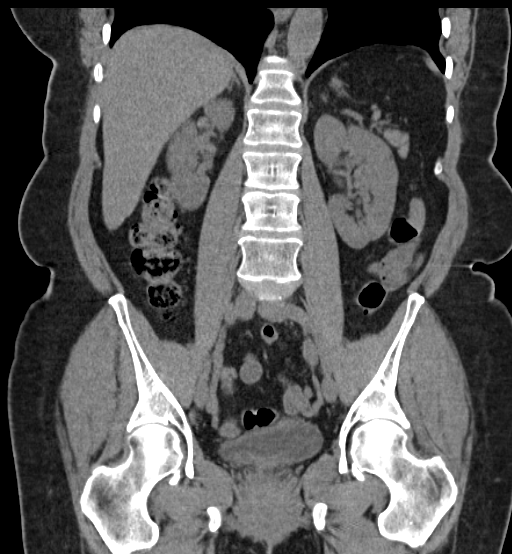

[Series 6: sagittal · sagittal · 0.51mm/px · 1 of 175 slices shown, 2 images]
[im 59/175  soft-tissue]
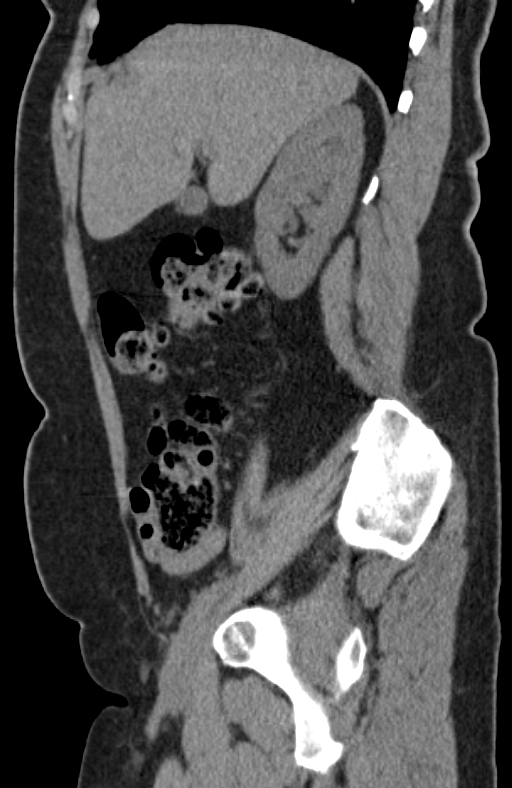
[im 59/175  bone]
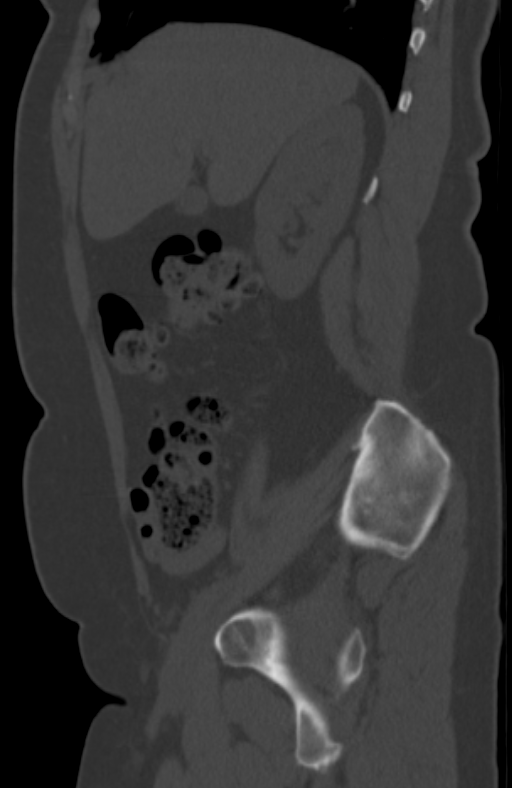

[7 of 46 positions shown; findings below may reference images not displayed]

FINDINGS: The lung bases are clear.  No pleural or pericardial effusion.

A 0.8 cm in diameter nonobstructing stone is identified in the upper
pole of the right kidney. No other urinary tract stones are present.
There is no hydronephrosis on the right or left. Focus of low
attenuation in the upper pole of the right kidney is compatible with
a cyst. The kidneys are otherwise unremarkable. Uterus, adnexa and
urinary bladder appear normal.

The gallbladder, spleen, adrenal glands and pancreas appear normal.
A few small scattered low attenuating lesions in the liver are most
consistent with cysts. Fat containing umbilical hernia is
identified. The stomach, small and large bowel and appendix appear
normal.

No lytic or sclerotic bony lesion is identified. Lower lumbar facet
arthropathy is seen.
IMPRESSION: 0.7 cm nonobstructing stone upper pole right kidney. Negative for
hydronephrosis or ureteral stone. No acute abnormality.

Fat containing umbilical hernia.

## 2019-06-25 ENCOUNTER — Other Ambulatory Visit: Payer: Self-pay

## 2019-06-25 ENCOUNTER — Emergency Department
Admission: EM | Admit: 2019-06-25 | Discharge: 2019-06-25 | Disposition: A | Discharge status: Home / Self Care | Payer: Medicare PPO | Attending: Emergency Medicine | Admitting: Emergency Medicine

## 2019-06-25 ENCOUNTER — Emergency Department: Payer: Medicare PPO

## 2019-06-25 DIAGNOSIS — Y999 Unspecified external cause status: Secondary | ICD-10-CM | POA: Insufficient documentation

## 2019-06-25 DIAGNOSIS — Y93I9 Activity, other involving external motion: Secondary | ICD-10-CM | POA: Diagnosis not present

## 2019-06-25 DIAGNOSIS — Y92410 Unspecified street and highway as the place of occurrence of the external cause: Secondary | ICD-10-CM | POA: Insufficient documentation

## 2019-06-25 DIAGNOSIS — Z79899 Other long term (current) drug therapy: Secondary | ICD-10-CM | POA: Diagnosis not present

## 2019-06-25 DIAGNOSIS — M791 Myalgia, unspecified site: Secondary | ICD-10-CM | POA: Diagnosis not present

## 2019-06-25 DIAGNOSIS — R0789 Other chest pain: Secondary | ICD-10-CM | POA: Diagnosis present

## 2019-06-25 DIAGNOSIS — M7918 Myalgia, other site: Secondary | ICD-10-CM

## 2019-06-25 MED ORDER — IBUPROFEN 600 MG PO TABS: 600.0000 mg | ORAL_TABLET | Freq: Three times a day (TID) | ORAL | 0 refills | Status: DC | PRN

## 2019-06-25 MED ORDER — CYCLOBENZAPRINE HCL 10 MG PO TABS: 10.0000 mg | ORAL_TABLET | Freq: Three times a day (TID) | ORAL | 0 refills | Status: DC | PRN

## 2019-06-25 MED ORDER — LIDOCAINE 5 % EX PTCH
1.0000 | MEDICATED_PATCH | Freq: Once | CUTANEOUS | Status: DC
  Administered 2019-06-25: 1 via TRANSDERMAL
  Filled 2019-06-25: qty 1
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
## 2019-06-25 NOTE — Discharge Instructions (Signed)
Follow discharge care instruction take medication as directed.  Be advised muscle axis may cause drowsiness.
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
## 2023-07-20 NOTE — Progress Notes (Signed)
 Patient ID: Carolyn Clark is a 73 y.o. female who presents for eval of medical issue.s     Assessment/Plan:     Essential hypertension BP is a bit high.   Continue dyazide. Incresae losartan  from 25 mg daily to 50 mg dail.      Kidney stones She is noting bilateral falank pain in the back with some urinary discomfort. Check urine today. If ok, set up CT kidney stones.   If UTI, will treat.   Elevated glucose She remains on glipzidie 10 mg am, 5 mg in the pm, but she occasionally forgest sthe evening dose. Check labs dtodya.   Change glipizide to XL 20 mg daily.     Orders Placed This Encounter  Procedures   CT Abdomen Pelvis Wo Contrast   Comprehensive Metabolic Panel   Lipid Panel   CBC w/ Differential   Hemoglobin A1c   Urinalysis with Microscopy with Culture Reflex    Medication adherence and barriers to the treatment plan have been addressed. Opportunities to optimize healthy behaviors have been discussed. Patient / caregiver voiced understanding.    Return in about 6 months (around 01/20/2024) for Annual physical.   Subjective:   Current Health Status Patient Active Problem List   Diagnosis Date Noted   Essential hypertension 08/27/2018   Routine general medical examination at a health care facility 12/19/2014   Elevated glucose 12/19/2014   Kidney stones 12/19/2014   Upper respiratory infection 01/22/2020   Chronic kidney disease, stage 3a (CMS-HCC) 05/18/2023   Right shoulder pain 06/13/2022   Right ankle pain 04/05/2021   Dysuria 03/27/2020   Chest pain 08/06/2019   Cardiac murmur 06/13/2017   Adhesive capsulitis of left shoulder 12/02/2015   Acute midline low back pain without sciatica 04/07/2015   Sinus congestion 04/07/2015    Here today for eval of medical issues.  She has noted that she has been having bilateral flank pain.  Often worsens when moving around.  Improved with sitting.  Also notes some dysuria.  No hematuria.     RE: HTN, she remains on dyazide and losartan  25 mg daily.  Tolerating well.  Reports bp at home is in the 135/60.    RE: diabetes.  She takes glipizide 10 mg in am and5 mg in pn.   Notes that she often forgets the eveing.     Allergies  Allergen Reactions   Amlodipine     Possible hair loss?   Lisinopril     cough   Metformin     Abdominal pain    Simvastatin Other (See Comments)    Joint pain    Current Outpatient Medications  Medication Sig Dispense Refill   acetaminophen  (TYLENOL ) 500 MG tablet Take by mouth.     atorvastatin  (LIPITOR ) 20 MG tablet Take 1 tablet (20 mg total) by mouth nightly. 90 tablet 3   ibuprofen  (ADVIL ,MOTRIN ) 200 MG tablet Take 3 tablets (600 mg total) by mouth every six (6) hours as needed for pain.     triamterene-hydroCHLOROthiazide (DYAZIDE) 37.5-25 mg per capsule Take 1 capsule by mouth daily. 90 capsule 3   acetaminophen -DM (THERAFLU FLU RELIEF DAYTIME) 1,000-30 mg PwPk Take by mouth. Theraflu severe cold (Patient not taking: Reported on 07/20/2023)     glipiZIDE (GLUCOTROL XL) 10 MG 24 hr tablet Take 2 tablets (20 mg total) by mouth daily. 180 tablet 3   losartan  (COZAAR ) 50 MG tablet Take 1 tablet (50 mg total) by mouth daily. 180 tablet 3  No current facility-administered medications for this visit.    Past Medical History:  Diagnosis Date   Uterine fibroid     Past Surgical History:  Procedure Laterality Date   PR CYSTO/URETERO W/LITHOTRIPSY &INDWELL STENT INSRT Right 09/19/2018   Procedure: CYSTOURETHROSCOPY, WITH URETEROSCOPY AND/OR PYELOSCOPY; WITH LITHOTRIPSY INCLUDING INSERTION OF INDWELLING URETERAL STENT;  Surgeon: Nicholaus Mt Viprakasit, MD;  Location: CYSTO PROCEDURE SUITES [facility];  Service: Urology   PR CYSTOURETHROSCOPY,URETER CATHETER Right 09/19/2018   Procedure: CYSTOURETHROSCOPY, W/URETERAL CATHETERIZATION, W/WO IRRIG, INSTILL, OR URETEROPYELOG, EXCLUS OF RADIOLG SVC;  Surgeon: Nicholaus Mt Viprakasit, MD;   Location: CYSTO PROCEDURE SUITES [facility];  Service: Urology   TUBAL LIGATION      Family History  Problem Relation Age of Onset   Hypertension Mother    Hypertension Father    No Known Problems Sister    No Known Problems Daughter    No Known Problems Maternal Grandmother    No Known Problems Maternal Grandfather    No Known Problems Paternal Grandmother    No Known Problems Paternal Grandfather    No Known Problems Other    BRCA 1/2 Neg Hx    Breast cancer Neg Hx    Cancer Neg Hx    Colon cancer Neg Hx    Endometrial cancer Neg Hx    Ovarian cancer Neg Hx     Social History   Tobacco Use   Smoking status: Never   Smokeless tobacco: Never  Vaping Use   Vaping status: Never Used  Substance Use Topics   Alcohol use: No    Alcohol/week: 0.0 standard drinks of alcohol   Drug use: No         Objective:    Vital Signs BP 138/74 (BP Site: L Arm, BP Position: Sitting, BP Cuff Size: Medium)   Pulse 88   Wt 78.2 kg (172 lb 4.8 oz)   SpO2 98%   BMI 29.56 kg/m    Exam Physical Exam:   Well developed, well nourished female in no acute distress.    Vitals:   07/20/23 1050  BP: 138/74  Pulse: 88  SpO2: 98%   LUNGS:  CTA CARDIOVASCULAR:  RRR without murmurs, or gallops, or rubs.  Pulses 2+ bilaterally and symmetric.   ABDOMEN:  NABS/ NT/ND/soft.  NO hsm nor abdominal masses.   EXTREMITIES:  No c/c/e.  No ulcers or abrasions.

## 2023-08-10 NOTE — Telephone Encounter (Signed)
 I'm not sure I know what's causing this. Please set up ov with me either on Fridya or early next week; doubleobok ok.  John K. Min, M.D.    [facility] at [facility]  [address] [place], [place]  [address] [phone]

## 2023-08-11 NOTE — Telephone Encounter (Signed)
Called patient, no answer. Unable to leave message as voicemail has not been set up.

## 2023-08-11 NOTE — Telephone Encounter (Signed)
 Spoke to patient regarding Dr. Christiane note offered to schedule appointment today, patient declined stating she would not be able to come in today. Appointment scheduled for June 10 per patient preference.

## 2023-12-29 NOTE — Nursing Note (Signed)
 Daughter to recovery to wait with patient.  Patient not wanting anything to eat or drink.

## 2023-12-29 NOTE — Anesthesia Postprocedure Evaluation (Signed)
 Patient: AMARA JUSTEN  Scheduled: Procedure(s) with comments: COLONOSCOPY FLEX; W/REMOV TUMOR/LES BY SNARE - GENERIC COLON PROPOFOL BP TO POOL(GOLYTELY)[employer] ON FILE  G3 COMPLETED  MC SENT:PER PT  REFERRAL NOTES:screening  Anesthesia Start Date/Time: 12/29/23 1056   Procedure: COLONOSCOPY FLEX; W/REMOV TUMOR/LES BY SNARE (Left) - GENERIC COLON PROPOFOL BP TO POOL(GOLYTELY)[employer] ON FILE  G3 COMPLETED  MC SENT:PER PT  REFERRAL NOTES:screening   Anesthesia type: general, TIVA   Pre-op diagnosis: Routine general medical examination at a health care  facility [Z00.00]   Location: GI [facility] OR [facility] [place] / GI PROCEDURES [facility]   Providers: Charlanne Kipper, MD     Final Anesthesia Type: general, TIVA  Anesthesia Staff: Anesthesiologist: Lora Lucie Rouge, DO     No lines, drains, or airways are recorded for this episode.     Patient Location:   Last vitals:  Vitals Value Taken Time  BP 126/58 12/29/23 11:35  Temp 36.3 C (97.3 F) 12/29/23 11:22  Pulse 84 12/29/23 11:35  Resp 13 12/29/23 11:35  SpO2 99 % 12/29/23 11:35  SpO2 Pulse        Level of consciousness: alert and awake Post vitals: stable PONV Present? no Pain score: 0 Pain management: adequate Airway patency: patent Cardiovascular status: stable and acceptable Respiratory status: acceptable, nonlabored ventilation and spontaneous ventilation Hydration status: acceptable    PACU PONV Meds: None  PACU Pain Meds: None  Anesthetic complications: There were no known notable events for this encounter.   _______________ Lucie JULIANNA Lora, DO 12/29/23

## 2024-01-08 NOTE — Telephone Encounter (Signed)
 Path letter sent

## 2024-01-22 NOTE — Progress Notes (Signed)
 Patient ID: Carolyn Clark is a 73 y.o. female who presents for CPE and for eval of medical issues.     Assessment/Plan:     Routine general medical examination at a health care facility CPE okt doay. COlonoscpoy in Oct 2025 wth a single adenoma; repeat in 5 years.   Mammogram Dec 2024 was normal. Set up mammogram.   Dexa in 2023 was normal. BP is good. Flu shot today.    Elevated glucose REmains on januvia and glipizide. Could not tolerate metformin. Check labs today.   Last time, there was a bump in the A1C to 9.9 which is too high; this is why we increased glipizide 10 mg daily to 10 mg twice a day and also jnauvia 50 mg daily.   Acute midline low back pain without sciatica She reports a recurrence of her lower back pain. Xrays in 2020 did show some arthritis. Declines physical therapy .  Will offer flexeril  5 mg ecery night as needed.  Sedation precautions.   Let me knwo if she changes her mind about PT.    Left-sided pelvic pain Due to the proximity to eht female organs, we will set up ultrasound pelvis to ensure NO SIGNS OF serious disease. If the pain resolves, you an cancel.     Orders Placed This Encounter  Procedures   US  Endovaginal (Non-OB)   Mammo Screening Bilateral   Comprehensive Metabolic Panel   Lipid Panel   CBC w/ Differential   Hemoglobin A1c   Albumin/creatinine urine ratio    Medication adherence and barriers to the treatment plan have been addressed. Opportunities to optimize healthy behaviors have been discussed. Patient / caregiver voiced understanding.    Return in about 4 months (around 05/21/2024).   Subjective:   Current Health Status Patient Active Problem List   Diagnosis Date Noted   Essential hypertension 08/27/2018   Routine general medical examination at a health care facility 12/19/2014   Elevated glucose 12/19/2014   Kidney stones 12/19/2014   Upper respiratory infection 01/22/2020   Left-sided pelvic pain  01/22/2024   Chronic kidney disease, stage 3a (CMS-HCC) 05/18/2023   Right shoulder pain 06/13/2022   Right ankle pain 04/05/2021   Dysuria 03/27/2020   Chest pain 08/06/2019   Cardiac murmur 06/13/2017   Adhesive capsulitis of left shoulder 12/02/2015   Acute midline low back pain without sciatica 04/07/2015   Sinus congestion 04/07/2015    Here today for CPE and for eval of medical issues.  She reports taht she has intermittent pains in the back, particualrly the right side.   This has been going on for the last month.  Does not recall any particular injury or heavy lifting.  She has topical meds, ibuprofen .  All have helped a bit, but symptosm recur.  NO weakness in the legs, or bowel or bladder incontinence.  RE: elevated glucose, she remains on januvia 50 mg daily and glipizide 20 mg daily.  She did ran out o januvia one week ago.  She does not check sugars.  Last time, A1C had bumped to 9.9.  She tries to avoid carbohydrates.    RE: HTN, she remains on dyazide one tablet a day, losaertan 50 mg daily.  Tolerating meds well.  BP is great today.  Reports that bp at home is in the same range.    Re: hyperlipidemia, lipito4 20 mg daily    She does report some discmofort in the left pelvic area. X 1 week. Never prior.  NO vaginal bleeding all.     Allergies[1]  Current Medications[2]  Past Medical History[3]  Past Surgical History[4]  Family History[5]  Short Social History[6]       Objective:    Vital Signs BP 128/74 (BP Site: L Arm, BP Position: Sitting, BP Cuff Size: Medium)   Pulse 83   Ht 162 cm (5' 3.78)   Wt 77.5 kg (170 lb 14.4 oz)   SpO2 98%   BMI 29.54 kg/m    Exam Physical Exam:   Well developed, well nourished female in no acute distress.    Vitals:   01/22/24 0953  BP: 128/74  Pulse: 83  SpO2: 98%   HEENT:  NCAT.  PERRLA/EOMI.  Neck supple with FROM.  No cervical lymphadenopathy.  No carotid bruits or elevated JVP.   LUNGS:   CTA CARDIOVASCULAR:  RRR without murmurs, or gallops, or rubs.  Pulses 2+ bilaterally and symmetric.   ABDOMEN:  NABS/ NT/ND/soft.  NO hsm nor abdominal masses.   EXTREMITIES:  No c/c/e.  No ulcers or abrasions.   NEURO:  Alert and oriented X 3.  No focal deficits with regards to sensation to light touch or strength.   PSYCH:  Mentation normal.           [1] Allergies Allergen Reactions   Amlodipine     Possible hair loss?   Lisinopril     cough   Metformin     Abdominal pain    Simvastatin Other (See Comments)    Joint pain  [2] Current Outpatient Medications  Medication Sig Dispense Refill   acetaminophen  (TYLENOL ) 500 MG tablet Take by mouth.     atorvastatin  (LIPITOR ) 20 MG tablet Take 1 tablet (20 mg total) by mouth nightly. 90 tablet 3   glipiZIDE (GLUCOTROL XL) 10 MG 24 hr tablet Take 2 tablets (20 mg total) by mouth daily. 180 tablet 3   ibuprofen  (ADVIL ,MOTRIN ) 200 MG tablet Take 3 tablets (600 mg total) by mouth every six (6) hours as needed for pain.     losartan  (COZAAR ) 50 MG tablet Take 1 tablet (50 mg total) by mouth daily. 180 tablet 3   acetaminophen -DM (THERAFLU FLU RELIEF DAYTIME) 1,000-30 mg PwPk Take by mouth. Theraflu severe cold (Patient not taking: Reported on 07/20/2023)     cyclobenzaprine  (FLEXERIL ) 5 MG tablet Take 1 tablet (5 mg total) by mouth two (2) times a day as needed for muscle spasms. 30 tablet 1   polyethylene glycol (GOLYTELY) 236-22.74-6.74 gram solution Take by mouth as directed per [facility] GI prep instructions, for split bowel prep. (Patient not taking: Reported on 01/22/2024) 4000 mL 0   SITagliptin phosphate (JANUVIA) 50 MG tablet Take 1 tablet (50 mg total) by mouth daily. (Patient not taking: Reported on 01/22/2024) 100 tablet 3   triamterene-hydroCHLOROthiazide (DYAZIDE) 37.5-25 mg per capsule Take 1 capsule by mouth daily. 90 capsule 3   No current facility-administered medications for this visit.  [3] Past Medical  History: Diagnosis Date   Arthritis    back   Diabetes mellitus (CMS-HCC)    Hyperlipidemia    Hypertension    Kidney stones    Uterine fibroid   [4] Past Surgical History: Procedure Laterality Date   PR COLSC FLX W/RMVL OF TUMOR POLYP LESION SNARE TQ Left 12/29/2023   Procedure: COLONOSCOPY FLEX; W/REMOV TUMOR/LES BY SNARE;  Surgeon: Charlanne Kipper, MD;  Location: [facility];  Service: Gastrointestinal   PR CYSTO/URETERO W/LITHOTRIPSY &INDWELL STENT INSRT Right 09/19/2018  Procedure: CYSTOURETHROSCOPY, WITH URETEROSCOPY AND/OR PYELOSCOPY; WITH LITHOTRIPSY INCLUDING INSERTION OF INDWELLING URETERAL STENT;  Surgeon: Nicholaus Mt Viprakasit, MD;  Location: CYSTO PROCEDURE SUITES [facility];  Service: Urology   PR CYSTOURETHROSCOPY,URETER CATHETER Right 09/19/2018   Procedure: CYSTOURETHROSCOPY, W/URETERAL CATHETERIZATION, W/WO IRRIG, INSTILL, OR URETEROPYELOG, EXCLUS OF RADIOLG SVC;  Surgeon: Nicholaus Mt Viprakasit, MD;  Location: CYSTO PROCEDURE SUITES [facility];  Service: Urology   TUBAL LIGATION    [5] Family History Problem Relation Age of Onset   Hypertension Mother    Hypertension Father    No Known Problems Sister    No Known Problems Daughter    No Known Problems Maternal Grandmother    No Known Problems Maternal Grandfather    No Known Problems Paternal Grandmother    No Known Problems Paternal Grandfather    No Known Problems Other    BRCA 1/2 Neg Hx    Breast cancer Neg Hx    Cancer Neg Hx    Colon cancer Neg Hx    Endometrial cancer Neg Hx    Ovarian cancer Neg Hx   [6] Social History Tobacco Use   Smoking status: Never   Smokeless tobacco: Never  Vaping Use   Vaping status: Never Used  Substance Use Topics   Alcohol use: No    Alcohol/week: 0.0 standard drinks of alcohol   Drug use: No

## 2024-04-02 ENCOUNTER — Other Ambulatory Visit: Payer: Self-pay

## 2024-04-02 ENCOUNTER — Encounter: Admission: EM | Disposition: A | Payer: Self-pay | Source: Home / Self Care | Attending: Internal Medicine

## 2024-04-02 ENCOUNTER — Encounter: Payer: Self-pay | Admitting: Internal Medicine

## 2024-04-02 ENCOUNTER — Emergency Department

## 2024-04-02 ENCOUNTER — Inpatient Hospital Stay

## 2024-04-02 ENCOUNTER — Inpatient Hospital Stay
Admission: EM | Admit: 2024-04-02 | Discharge: 2024-04-04 | DRG: 321 | Disposition: A | Discharge status: Home / Self Care | Attending: Internal Medicine | Admitting: Internal Medicine

## 2024-04-02 DIAGNOSIS — E782 Mixed hyperlipidemia: Secondary | ICD-10-CM | POA: Diagnosis present

## 2024-04-02 DIAGNOSIS — E119 Type 2 diabetes mellitus without complications: Secondary | ICD-10-CM | POA: Diagnosis present

## 2024-04-02 DIAGNOSIS — Z7982 Long term (current) use of aspirin: Secondary | ICD-10-CM

## 2024-04-02 DIAGNOSIS — E1159 Type 2 diabetes mellitus with other circulatory complications: Secondary | ICD-10-CM | POA: Diagnosis not present

## 2024-04-02 DIAGNOSIS — Z7984 Long term (current) use of oral hypoglycemic drugs: Secondary | ICD-10-CM | POA: Diagnosis not present

## 2024-04-02 DIAGNOSIS — E8721 Acute metabolic acidosis: Secondary | ICD-10-CM | POA: Diagnosis present

## 2024-04-02 DIAGNOSIS — R079 Chest pain, unspecified: Secondary | ICD-10-CM | POA: Diagnosis not present

## 2024-04-02 DIAGNOSIS — Z6831 Body mass index (BMI) 31.0-31.9, adult: Secondary | ICD-10-CM

## 2024-04-02 DIAGNOSIS — Z888 Allergy status to other drugs, medicaments and biological substances status: Secondary | ICD-10-CM | POA: Diagnosis not present

## 2024-04-02 DIAGNOSIS — I5021 Acute systolic (congestive) heart failure: Secondary | ICD-10-CM

## 2024-04-02 DIAGNOSIS — I213 ST elevation (STEMI) myocardial infarction of unspecified site: Secondary | ICD-10-CM | POA: Diagnosis not present

## 2024-04-02 DIAGNOSIS — I2102 ST elevation (STEMI) myocardial infarction involving left anterior descending coronary artery: Principal | ICD-10-CM | POA: Diagnosis present

## 2024-04-02 DIAGNOSIS — I5023 Acute on chronic systolic (congestive) heart failure: Secondary | ICD-10-CM | POA: Diagnosis present

## 2024-04-02 DIAGNOSIS — E785 Hyperlipidemia, unspecified: Secondary | ICD-10-CM

## 2024-04-02 DIAGNOSIS — I2510 Atherosclerotic heart disease of native coronary artery without angina pectoris: Secondary | ICD-10-CM | POA: Diagnosis present

## 2024-04-02 DIAGNOSIS — I255 Ischemic cardiomyopathy: Secondary | ICD-10-CM | POA: Diagnosis present

## 2024-04-02 DIAGNOSIS — Z79899 Other long term (current) drug therapy: Secondary | ICD-10-CM

## 2024-04-02 DIAGNOSIS — E66811 Obesity, class 1: Secondary | ICD-10-CM | POA: Diagnosis present

## 2024-04-02 DIAGNOSIS — Z8249 Family history of ischemic heart disease and other diseases of the circulatory system: Secondary | ICD-10-CM

## 2024-04-02 DIAGNOSIS — I110 Hypertensive heart disease with heart failure: Secondary | ICD-10-CM | POA: Diagnosis present

## 2024-04-02 HISTORY — DX: Type 2 diabetes mellitus without complications: E11.9

## 2024-04-02 HISTORY — DX: Essential (primary) hypertension: I10

## 2024-04-02 LAB — CBC WITH DIFFERENTIAL/PLATELET
Abs Immature Granulocytes: 0.01 10*3/uL (ref 0.00–0.07)
Basophils Absolute: 0 10*3/uL (ref 0.0–0.1)
Basophils Relative: 1 %
Eosinophils Absolute: 0.1 10*3/uL (ref 0.0–0.5)
Eosinophils Relative: 2 %
HCT: 41.3 % (ref 36.0–46.0)
Hemoglobin: 13.2 g/dL (ref 12.0–15.0)
Immature Granulocytes: 0 %
Lymphocytes Relative: 48 %
Lymphs Abs: 2.6 10*3/uL (ref 0.7–4.0)
MCH: 29.1 pg (ref 26.0–34.0)
MCHC: 32 g/dL (ref 30.0–36.0)
MCV: 91 fL (ref 80.0–100.0)
Monocytes Absolute: 0.6 10*3/uL (ref 0.1–1.0)
Monocytes Relative: 10 %
Neutro Abs: 2.1 10*3/uL (ref 1.7–7.7)
Neutrophils Relative %: 39 %
Platelets: 249 10*3/uL (ref 150–400)
RBC: 4.54 MIL/uL (ref 3.87–5.11)
RDW: 13.5 % (ref 11.5–15.5)
WBC: 5.4 10*3/uL (ref 4.0–10.5)
nRBC: 0 % (ref 0.0–0.2)

## 2024-04-02 LAB — PROTIME-INR
INR: 0.9 (ref 0.8–1.2)
Prothrombin Time: 13.2 s (ref 11.4–15.2)

## 2024-04-02 LAB — COMPREHENSIVE METABOLIC PANEL WITH GFR
ALT: 17 U/L (ref 0–44)
AST: 24 U/L (ref 15–41)
Albumin: 4.5 g/dL (ref 3.5–5.0)
Alkaline Phosphatase: 83 U/L (ref 38–126)
Anion gap: 13 (ref 5–15)
BUN: 20 mg/dL (ref 8–23)
CO2: 24 mmol/L (ref 22–32)
Calcium: 10.4 mg/dL — ABNORMAL HIGH (ref 8.9–10.3)
Chloride: 104 mmol/L (ref 98–111)
Creatinine, Ser: 1.12 mg/dL — ABNORMAL HIGH (ref 0.44–1.00)
GFR, Estimated: 52 mL/min — ABNORMAL LOW
Glucose, Bld: 188 mg/dL — ABNORMAL HIGH (ref 70–99)
Potassium: 3.9 mmol/L (ref 3.5–5.1)
Sodium: 141 mmol/L (ref 135–145)
Total Bilirubin: 0.4 mg/dL (ref 0.0–1.2)
Total Protein: 8.2 g/dL — ABNORMAL HIGH (ref 6.5–8.1)

## 2024-04-02 LAB — GLUCOSE, CAPILLARY
Glucose-Capillary: 172 mg/dL — ABNORMAL HIGH (ref 70–99)
Glucose-Capillary: 177 mg/dL — ABNORMAL HIGH (ref 70–99)

## 2024-04-02 LAB — POCT ACTIVATED CLOTTING TIME
Activated Clotting Time: 271 s
Activated Clotting Time: 276 s
Activated Clotting Time: 307 s

## 2024-04-02 LAB — HEMOGLOBIN A1C
Hgb A1c MFr Bld: 8.3 % — ABNORMAL HIGH (ref 4.8–5.6)
Mean Plasma Glucose: 191.51 mg/dL

## 2024-04-02 LAB — TROPONIN T, HIGH SENSITIVITY
Troponin T High Sensitivity: 13 ng/L (ref 0–19)
Troponin T High Sensitivity: 892 ng/L (ref 0–19)

## 2024-04-02 LAB — MRSA NEXT GEN BY PCR, NASAL: MRSA by PCR Next Gen: NOT DETECTED

## 2024-04-02 LAB — CG4 I-STAT (LACTIC ACID): Lactic Acid, Venous: 1.5 mmol/L (ref 0.5–1.9)

## 2024-04-02 LAB — LIPID PANEL
Cholesterol: 188 mg/dL (ref 0–200)
HDL: 46 mg/dL
LDL Cholesterol: 111 mg/dL — ABNORMAL HIGH (ref 0–99)
Total CHOL/HDL Ratio: 4.1 ratio
Triglycerides: 159 mg/dL — ABNORMAL HIGH
VLDL: 32 mg/dL (ref 0–40)

## 2024-04-02 LAB — POCT I-STAT CREATININE: Creatinine, Ser: 1 mg/dL (ref 0.44–1.00)

## 2024-04-02 LAB — APTT: aPTT: <22 s — ABNORMAL LOW (ref 24–36)

## 2024-04-02 LAB — LACTIC ACID, PLASMA: Lactic Acid, Venous: 2.9 mmol/L (ref 0.5–1.9)

## 2024-04-02 MED ORDER — INSULIN ASPART 100 UNIT/ML IJ SOLN: 0.0000 [IU] | Freq: Every day | INTRAMUSCULAR | Status: DC

## 2024-04-02 MED ORDER — FENTANYL CITRATE (PF) 100 MCG/2ML IJ SOLN
INTRAMUSCULAR | Status: DC | PRN
  Administered 2024-04-02: 25 ug via INTRAVENOUS

## 2024-04-02 MED ORDER — ACETAMINOPHEN 325 MG PO TABS: 650.0000 mg | ORAL_TABLET | ORAL | Status: DC | PRN

## 2024-04-02 MED ORDER — INSULIN ASPART 100 UNIT/ML IJ SOLN
0.0000 [IU] | Freq: Three times a day (TID) | INTRAMUSCULAR | Status: DC
  Administered 2024-04-02 – 2024-04-03 (×3): 3 [IU] via SUBCUTANEOUS
  Filled 2024-04-02 (×3): qty 3

## 2024-04-02 MED ORDER — HEPARIN SODIUM (PORCINE) 5000 UNIT/ML IJ SOLN: 60.0000 [IU]/kg | Freq: Once | INTRAMUSCULAR | Status: DC

## 2024-04-02 MED ORDER — FENTANYL CITRATE (PF) 100 MCG/2ML IJ SOLN
INTRAMUSCULAR | Status: AC
  Filled 2024-04-02: qty 2

## 2024-04-02 MED ORDER — ASPIRIN 81 MG PO CHEW
324.0000 mg | CHEWABLE_TABLET | Freq: Once | ORAL | Status: AC
  Administered 2024-04-02: 324 mg via ORAL

## 2024-04-02 MED ORDER — SODIUM CHLORIDE 0.9 % IV SOLN: INTRAVENOUS | Status: AC

## 2024-04-02 MED ORDER — HEPARIN SODIUM (PORCINE) 5000 UNIT/ML IJ SOLN: 4000.0000 [IU] | Freq: Once | INTRAMUSCULAR | Status: DC

## 2024-04-02 MED ORDER — HEPARIN SODIUM (PORCINE) 1000 UNIT/ML IJ SOLN
INTRAMUSCULAR | Status: AC
  Filled 2024-04-02: qty 10

## 2024-04-02 MED ORDER — ENOXAPARIN SODIUM 40 MG/0.4ML IJ SOSY
40.0000 mg | PREFILLED_SYRINGE | INTRAMUSCULAR | Status: DC
  Administered 2024-04-03: 40 mg via SUBCUTANEOUS
  Filled 2024-04-02: qty 0.4

## 2024-04-02 MED ORDER — PRASUGREL HCL 10 MG PO TABS
ORAL_TABLET | ORAL | Status: AC
  Filled 2024-04-02: qty 6

## 2024-04-02 MED ORDER — SODIUM CHLORIDE 0.9% FLUSH: 3.0000 mL | INTRAVENOUS | Status: DC | PRN

## 2024-04-02 MED ORDER — VERAPAMIL HCL 2.5 MG/ML IV SOLN
INTRAVENOUS | Status: DC | PRN
  Administered 2024-04-02 (×2): 2.5 mg via INTRACORONARY

## 2024-04-02 MED ORDER — LABETALOL HCL 5 MG/ML IV SOLN: 10.0000 mg | INTRAVENOUS | Status: AC | PRN

## 2024-04-02 MED ORDER — MIDAZOLAM HCL 2 MG/2ML IJ SOLN
INTRAMUSCULAR | Status: AC
  Filled 2024-04-02: qty 2

## 2024-04-02 MED ORDER — HEPARIN (PORCINE) IN NACL 1000-0.9 UT/500ML-% IV SOLN
INTRAVENOUS | Status: DC | PRN
  Administered 2024-04-02: 1000 mL

## 2024-04-02 MED ORDER — ATORVASTATIN CALCIUM 80 MG PO TABS
80.0000 mg | ORAL_TABLET | Freq: Every day | ORAL | Status: DC
  Administered 2024-04-02 – 2024-04-03 (×2): 80 mg via ORAL
  Filled 2024-04-02 (×2): qty 1

## 2024-04-02 MED ORDER — PRASUGREL HCL 10 MG PO TABS
10.0000 mg | ORAL_TABLET | Freq: Every day | ORAL | Status: DC
  Administered 2024-04-03: 10 mg via ORAL
  Filled 2024-04-02 (×2): qty 1

## 2024-04-02 MED ORDER — NITROGLYCERIN 1 MG/10 ML FOR IR/CATH LAB
INTRA_ARTERIAL | Status: DC | PRN
  Administered 2024-04-02: 200 ug via INTRACORONARY
  Administered 2024-04-02: 100 ug via INTRACORONARY

## 2024-04-02 MED ORDER — VERAPAMIL HCL 2.5 MG/ML IV SOLN
INTRAVENOUS | Status: AC
  Filled 2024-04-02: qty 2

## 2024-04-02 MED ORDER — LIDOCAINE HCL (PF) 1 % IJ SOLN
INTRAMUSCULAR | Status: DC | PRN
  Administered 2024-04-02: 5 mL

## 2024-04-02 MED ORDER — SODIUM CHLORIDE 0.9% FLUSH
3.0000 mL | Freq: Two times a day (BID) | INTRAVENOUS | Status: DC
  Administered 2024-04-02 – 2024-04-03 (×3): 3 mL via INTRAVENOUS

## 2024-04-02 MED ORDER — HEPARIN SODIUM (PORCINE) 1000 UNIT/ML IJ SOLN
INTRAMUSCULAR | Status: DC | PRN
  Administered 2024-04-02 (×2): 2000 [IU] via INTRAVENOUS
  Administered 2024-04-02 (×2): 4000 [IU] via INTRAVENOUS

## 2024-04-02 MED ORDER — HEPARIN (PORCINE) 25000 UT/250ML-% IV SOLN: 850.0000 [IU]/h | INTRAVENOUS | Status: DC

## 2024-04-02 MED ORDER — ONDANSETRON HCL 4 MG/2ML IJ SOLN: 4.0000 mg | Freq: Four times a day (QID) | INTRAMUSCULAR | Status: DC | PRN

## 2024-04-02 MED ORDER — ASPIRIN 81 MG PO CHEW
81.0000 mg | CHEWABLE_TABLET | Freq: Every day | ORAL | Status: DC
  Administered 2024-04-03: 81 mg via ORAL
  Filled 2024-04-02: qty 1

## 2024-04-02 MED ORDER — LIDOCAINE HCL 1 % IJ SOLN
INTRAMUSCULAR | Status: AC
  Filled 2024-04-02: qty 20

## 2024-04-02 MED ORDER — HEPARIN (PORCINE) IN NACL 1000-0.9 UT/500ML-% IV SOLN
INTRAVENOUS | Status: AC
  Filled 2024-04-02: qty 1000

## 2024-04-02 MED ORDER — FUROSEMIDE 10 MG/ML IJ SOLN
INTRAMUSCULAR | Status: AC
  Filled 2024-04-02: qty 4

## 2024-04-02 MED ORDER — FUROSEMIDE 10 MG/ML IJ SOLN
INTRAMUSCULAR | Status: DC | PRN
  Administered 2024-04-02: 20 mg via INTRAVENOUS

## 2024-04-02 MED ORDER — SODIUM CHLORIDE 0.9 % IV SOLN: 250.0000 mL | INTRAVENOUS | Status: AC | PRN

## 2024-04-02 MED ORDER — IOHEXOL 300 MG/ML  SOLN
INTRAMUSCULAR | Status: DC | PRN
  Administered 2024-04-02: 150 mL

## 2024-04-02 MED ORDER — PRASUGREL HCL 10 MG PO TABS
ORAL_TABLET | ORAL | Status: DC | PRN
  Administered 2024-04-02: 60 mg via ORAL

## 2024-04-02 MED ORDER — ORAL CARE MOUTH RINSE: 15.0000 mL | OROMUCOSAL | Status: DC | PRN

## 2024-04-02 MED ORDER — CHLORHEXIDINE GLUCONATE CLOTH 2 % EX PADS
6.0000 | MEDICATED_PAD | Freq: Every day | CUTANEOUS | Status: DC
  Administered 2024-04-02 – 2024-04-04 (×3): 6 via TOPICAL

## 2024-04-02 MED ORDER — CARVEDILOL 3.125 MG PO TABS
3.1250 mg | ORAL_TABLET | Freq: Two times a day (BID) | ORAL | Status: DC
  Administered 2024-04-02 – 2024-04-03 (×3): 3.125 mg via ORAL
  Filled 2024-04-02 (×3): qty 1

## 2024-04-02 MED ORDER — HYDRALAZINE HCL 20 MG/ML IJ SOLN: 10.0000 mg | INTRAMUSCULAR | Status: AC | PRN

## 2024-04-02 NOTE — Progress Notes (Addendum)
 Muhammad with the Lab called to report a critical Lactic 2.9 and critical Troponin 892. Made MD aware of both.

## 2024-04-02 NOTE — Brief Op Note (Signed)
 BRIEF CARDIAC CATHETERIZATION NOTE  04/02/2024  2:35 PM  PATIENT:  Carolyn Clark  74 y.o. female  PRE-OPERATIVE DIAGNOSIS:  STEMI  POST-OPERATIVE DIAGNOSIS:  SAME  PROCEDURE:  Procedures: Coronary/Graft Acute MI Revascularization (N/A) LEFT HEART CATH AND CORONARY ANGIOGRAPHY (N/A)  SURGEON:  Surgeons and Role:    * Brylan Seubert, MD - Primary  FINDINGS: Severe single-vessel CAD with thrombotic occlusion of mid LAD followed by 80-90% mid/distal LAD stenosis.  Nonobstructive CAD noted in LCx and RCA. Moderately elevated LVEDP (25 mmHg). LVEF 30-35% with mid/apical anterior and inferior akinesis. Successful PCI to mid and mid/distal LAD with non-overlapping Onyx Frontier 2.75 x 15 mm (proximal) and 2.25 x 15 mm (distal) DES with 0% residual stenosis and TIMI-3 flow.  RECOMMENDATIONS: Admit to ICU for post-STEMI care. DAPT with ASA and prasugrel  for at least 12 months. Aggressive secondary prevention of CAD. Obtain echocardiogram. Gentle diuresis and initiation of GDMT as tolerated.  Lonni Hanson, MD [facility]

## 2024-04-02 NOTE — Consult Note (Signed)
 "  Cardiology Consultation   Patient ID: Carolyn Clark MRN: [id number]; DOB: 1950-10-24  Admit date: 04/02/2024 Date of Consult: 04/02/2024  PCP:  Tiffany Lamar Rush, MD   [facility] Providers Cardiologist:  New     Patient Profile: Carolyn Clark is a 74 y.o. female with a hx of hypertension, hyperlipidemia, and type 2 diabetes mellitus, who is being seen 04/02/2024 for the evaluation of chest pain and abnormal EKG at the request of Dr. Nicholaus.  History of Present Illness: Carolyn Clark reports that she was in her usual state of health until approximately 10 PM yesterday.  She initially developed some soreness in the left arm that likely radiated towards the center of her chest.  She describes it as a soreness; she thought that she may have strained a muscle while hanging plastic sheets in front of her windows.  The pain waxed and waned through the night but became more severe this morning, prompting her to come to the emergency department.  She notes associated mild nausea but no shortness of breath, palpitations, or lightheadedness.  She has not thrown up.  In the ED, she was found to have anteroseptal ST elevation with reciprocal changes in the inferolateral leads, prompting activation of the STEMI team.  On my evaluation, she continues to complain of 8/10 chest pain.  She has not had anything like this before.  She was referred for emergent cardiac catheterization and possible PCI; this revealed occluded mid LAD just after a large first septal branch as well as 80-90% stenosis of the mid/distal LAD.  Both sites were treated with nonoverlapping drug-eluting stents.  Nonobstructive disease was seen in the remainder of the coronary arteries.  LVEF was moderately to severely reduced with it and apical anterior and inferior akinesis.  LVEDP was moderately elevated at 25 mmHg.  The patient received furosemide  20 mg IV x 1 at the Zaydyn Havey of the procedure.  Following catheterization and PCI, her chest  pain had improved to 3/10.   Past Medical History:  Diagnosis Date   Hypercholesteremia    Hypertension    Renal disorder    kidney stone   Type 2 diabetes mellitus (HCC)     Past Surgical History:  Procedure Laterality Date   TUBAL LIGATION        Scheduled Meds:  [START ON 04/03/2024] aspirin   81 mg Oral Daily   carvedilol   3.125 mg Oral BID WC   Chlorhexidine  Gluconate Cloth  6 each Topical Q0600   [START ON 04/03/2024] enoxaparin  (LOVENOX ) injection  40 mg Subcutaneous Q24H   insulin  aspart  0-15 Units Subcutaneous TID WC   insulin  aspart  0-5 Units Subcutaneous QHS   [START ON 04/03/2024] prasugrel   10 mg Oral Daily   sodium chloride  flush  3 mL Intravenous Q12H   Continuous Infusions:  sodium chloride  10 mL/hr at 04/02/24 1452   sodium chloride      PRN Meds: sodium chloride , acetaminophen , hydrALAZINE , labetalol , ondansetron  (ZOFRAN ) IV, sodium chloride  flush  Allergies:   Allergies[1]  Social History:   Social History   Socioeconomic History   Marital status: Single    Spouse name: Not on file   Number of children: Not on file   Years of education: Not on file   Highest education level: Not on file  Occupational History   Not on file  Tobacco Use   Smoking status: Never   Smokeless tobacco: Never  Substance and Sexual Activity   Alcohol use: Yes  Comment: One bottle of wine every 3 months.   Drug use: Never   Sexual activity: Not on file  Other Topics Concern   Not on file  Social History Narrative   Lives with daughter   Social Drivers of Health   Tobacco Use: Low Risk (04/02/2024)   Patient History    Smoking Tobacco Use: Never    Smokeless Tobacco Use: Never    Passive Exposure: Not on file  Financial Resource Strain: Low Risk (01/19/2023)   Received from [facility]   Overall Financial Resource Strain (CARDIA)    Difficulty of Paying Living Expenses: Not hard at all  Food Insecurity: No Food Insecurity (04/02/2024)   Epic     Worried About Radiation Protection Practitioner of Food in the Last Year: Never true    Ran Out of Food in the Last Year: Never true  Transportation Needs: No Transportation Needs (04/02/2024)   Epic    Lack of Transportation (Medical): No    Lack of Transportation (Non-Medical): No  Physical Activity: Insufficiently Active (03/02/2022)   Received from [facility]   Exercise Vital Sign    On average, how many days per week do you engage in moderate to strenuous exercise (like a brisk walk)?: 2 days    On average, how many minutes do you engage in exercise at this level?: 20 min  Stress: No Stress Concern Present (03/02/2022)   Received from [facility] of Occupational Health - Occupational Stress Questionnaire    Feeling of Stress : Not at all  Social Connections: Moderately Isolated (04/02/2024)   Social Connection and Isolation Panel    Frequency of Communication with Friends and Family: Three times a week    Frequency of Social Gatherings with Friends and Family: Three times a week    Attends Religious Services: 1 to 4 times per year    Active Member of Clubs or Organizations: No    Attends Banker Meetings: Never    Marital Status: Never married  Intimate Partner Violence: Not At Risk (04/02/2024)   Epic    Fear of Current or Ex-Partner: No    Emotionally Abused: No    Physically Abused: No    Sexually Abused: No  Depression (PHQ2-9): Not on file  Alcohol Screen: Not on file  Housing: Low Risk (04/02/2024)   Epic    Unable to Pay for Housing in the Last Year: No    Number of Times Moved in the Last Year: 0    Homeless in the Last Year: No  Utilities: Not At Risk (04/02/2024)   Epic    Threatened with loss of utilities: No  Health Literacy: Low Risk (01/19/2023)   Received from [facility] Literacy    How often do you need to have someone help you when you read instructions, pamphlets, or other written material from your doctor or pharmacy?:  Never    Family History:   Family History  Problem Relation Age of Onset   Heart disease Mother    Heart disease Sister    Heart disease Sister      ROS:  Please see the history of present illness. All other ROS reviewed and negative.     Physical Exam/Data: Vitals:   04/02/24 1322 04/02/24 1326 04/02/24 1343 04/02/24 1501  BP:    (!) 168/104  Pulse:  92  91  Resp:  20  20  Temp:    ROLLEN)  97.5 F (36.4 C)  TempSrc:    Oral  SpO2:  97%  98%  Weight: 78.9 kg  78.9 kg 82.5 kg  Height:   5' 3.78 (1.62 m) 5' 4 (1.626 m)    Intake/Output Summary (Last 24 hours) at 04/02/2024 1637 Last data filed at 04/02/2024 1444 Gross per 24 hour  Intake --  Output 300 ml  Net -300 ml      04/02/2024    3:01 PM 04/02/2024    1:43 PM 04/02/2024    1:22 PM  Last 3 Weights  Weight (lbs) 181 lb 14.1 oz 173 lb 15.1 oz 173 lb 15.1 oz  Weight (kg) 82.5 kg 78.9 kg 78.9 kg     Body mass index is 31.22 kg/m.  General:  Well nourished, well developed, in no acute distress HEENT: normal Neck: no JVD Vascular: No carotid bruits; Distal pulses 2+ bilaterally Cardiac: Regular rate and rhythm without murmurs, rubs, or gallops. Lungs:  clear to auscultation bilaterally, no wheezing, rhonchi or rales  Abd: soft, nontender, no hepatomegaly  Ext: no edema Musculoskeletal:  No deformities, BUE and BLE strength normal and equal Skin: warm and dry  Neuro:  CNs 2-12 intact, no focal abnormalities noted Psych:  Normal affect   EKG:  The EKG performed at 1:15 PM was personally reviewed and demonstrates: Normal sinus rhythm with anteroseptal ST elevation and nonspecific ST changes in the inferolateral leads.  Repeat EKG at 3:09 PM shows significant improvement in anteroseptal ST segment elevation.  ST depressions in the inferior leads are still present. Telemetry:  Telemetry was personally reviewed and demonstrates: Normal sinus rhythm with PVCs.  Relevant CV Studies: LHC/PCI (04/02/24): Severe  single-vessel coronary artery disease with thrombotic occlusion of the mid LAD as well as 80-90% mid/distal LAD stenosis.  Nonobstructive disease observed in the LCx and RCA.  LVEF 30 to 35% with LAD territory wall motion abnormality.  LVEDP moderately elevated at 25 mmHg.  Successful PCI to mid and mid/distal LAD with nonoverlapping Onyx frontier 2.75 x 15 mm and 2.25 x 15 mm drug-eluting stents with 0% residual stenosis and TIMI-3 flow.  Laboratory Data: High Sensitivity Troponin:  No results for input(s): TROPONINIHS in the last 720 hours.  Recent Labs  Lab 04/02/24 1313  TRNPT 13      Chemistry Recent Labs  Lab 04/02/24 1313 04/02/24 1339  NA 141  --   K 3.9  --   CL 104  --   CO2 24  --   GLUCOSE 188*  --   BUN 20  --   CREATININE 1.12* 1.00  CALCIUM  10.4*  --   GFRNONAA 52*  --   ANIONGAP 13  --     Recent Labs  Lab 04/02/24 1313  PROT 8.2*  ALBUMIN 4.5  AST 24  ALT 17  ALKPHOS 83  BILITOT 0.4   Lipids  Recent Labs  Lab 04/02/24 1330  CHOL 188  TRIG 159*  HDL 46  LDLCALC 111*  CHOLHDL 4.1    Hematology Recent Labs  Lab 04/02/24 1313  WBC 5.4  RBC 4.54  HGB 13.2  HCT 41.3  MCV 91.0  MCH 29.1  MCHC 32.0  RDW 13.5  PLT 249   Thyroid No results for input(s): TSH, FREET4 in the last 168 hours.  BNPNo results for input(s): BNP, PROBNP in the last 168 hours.  DDimer No results for input(s): DDIMER in the last 168 hours.  Radiology/Studies:  No results found.   Assessment and  Plan: Anterior STEMI: Patient presents with chest pain that began last night, found to have anteroseptal ST elevation on EKG and subsequently occluded LAD on catheterization.  She underwent successful primary PCI to the mid and mid/distal LAD with restoration of TIMI-3 flow.  Chest pain has improved significantly.  I suspect some of her lingering chest discomfort is related to infarcted myocardium, given her delayed presentation. Admit to ICU for post STEMI care.   Case has been discussed with Dr. Laurita. Dual antiplatelet therapy with aspirin  and prasugrel  for at least 12 months. Aggressive secondary prevention including high intensity statin therapy. Initiate carvedilol  3.125 mg twice daily. Outpatient cardiac rehab  Acute HFrEF due to ischemic cardiomyopathy: LVEF moderately to severely reduced at 30-35% with LAD territory wall motion abnormality.  LVEDP also moderately elevated at 25 mmHg.  Patient was given furosemide  20 mg IV x 1 at the Latina Frank of the catheterization.  Lactate normal arguing against cardiogenic shock Obtain echocardiogram. Initiate carvedilol  3.125 mg twice daily. If renal function and blood pressure allow, consider adding ACE inhibitor/ARB/ARNI as soon as tomorrow. Continue gentle diuresis.  Hyperlipidemia associated with type 2 diabetes mellitus: Follow-up lipid panel and hemoglobin A1c. Initiate atorvastatin  80 mg daily to target LDL less than 70 (ideally below 55). Sliding scale insulin  with further management per the hospitalist team  CODE STATUS: Full code confirmed with patient prior to catheterization   Risk Assessment/Risk Scores:    TIMI Risk Score for ST  Elevation MI:   The patient's TIMI risk score is 5, which indicates a 12.4% risk of all cause mortality at 30 days.   [place]  Heart Association (NYHA) Functional Class NYHA Class II  For questions or updates, please contact [facility] Please consult [email or web address] for contact info under [facility].  Signed, Lonni Hanson, MD  04/02/2024 4:37 PM     [1] No Known Allergies  "

## 2024-04-02 NOTE — Progress Notes (Addendum)
 Patient was admitted to ICU after going to cath lab for stent placement. Patient did not have  catheterization entry charted on the avatar nor TR band as well as PIV to right AC.

## 2024-04-02 NOTE — ED Notes (Signed)
Code  stemi  called  to  [facility] 

## 2024-04-02 NOTE — Plan of Care (Signed)
" °  Problem: Education: Goal: Knowledge of General Education information will improve Description: Including pain rating scale, medication(s)/side effects and non-pharmacologic comfort measures Outcome: Progressing   Problem: Clinical Measurements: Goal: Will remain free from infection Outcome: Progressing Goal: Respiratory complications will improve Outcome: Progressing   Problem: Elimination: Goal: Will not experience complications related to urinary retention Outcome: Progressing   Problem: Skin Integrity: Goal: Risk for impaired skin integrity will decrease Outcome: Progressing   "

## 2024-04-02 NOTE — Progress Notes (Signed)
 SPIRITUAL CARE AND COUNSELING CONSULT NOTE   VISIT SUMMARY  Chaplain provided support to daughter and extended family while patient was in surgery.   SPIRITUAL ENCOUNTER                                                                                                                                                                      Type of Visit: Initial Care provided to:: Family Conversation partners present during encounter: Nurse Referral source: Code page Reason for visit: Code OnCall Visit: No   SPIRITUAL FRAMEWORK  Presenting Themes: Coping tools, Courage hope and growth, Community and relationships Community/Connection: Family, Friend(s), Faith community, Spiritual leader Patient Stress Factors: None identified Family Stress Factors: Major life changes   GOALS       INTERVENTIONS   Spiritual Care Interventions Made: Compassionate presence, Established relationship of care and support, Reflective listening, Normalization of emotions, Prayer, Encouragement    INTERVENTION OUTCOMES   Outcomes: Connection to spiritual care, Reduced anxiety, Reduced fear, Reduced isolation, Awareness of support  SPIRITUAL CARE PLAN   Spiritual Care Issues Still Outstanding: Chaplain will continue to follow    If immediate needs arise,    Twanisha Foulk  04/02/2024 3:17 PM

## 2024-04-02 NOTE — ED Provider Notes (Addendum)
 "  [facility] Provider Note    Event Date/Time   First MD Initiated Contact with Patient 04/02/24 1311     (approximate)   History   No chief complaint on file.   HPI  Carolyn Clark is a 74 y.o. female with hypertension and hyperlipidemia who presents with 24 hours of midsternal chest pain shortness of breath and nausea.  Patient reports pain initiated yesterday and has progressed and increased in intensity.  Denies any recent illness including cough or congestion.  She presents with her daughter who contributes to the history     Physical Exam   Triage Vital Signs: ED Triage Vitals  Encounter Vitals Group     BP      Girls Systolic BP Percentile      Girls Diastolic BP Percentile      Boys Systolic BP Percentile      Boys Diastolic BP Percentile      Pulse      Resp      Temp      Temp src      SpO2      Weight      Height      Head Circumference      Peak Flow      Pain Score      Pain Loc      Pain Education      Exclude from Growth Chart     Most recent vital signs: Vitals:   04/02/24 1326 04/02/24 1501  BP:  (!) 168/104  Pulse: 92 91  Resp: 20 20  Temp:  (!) 97.5 F (36.4 C)  SpO2: 97% 98%    Nursing Triage Note reviewed. Vital signs reviewed and patients oxygen saturation is normoxic  General: Patient is well nourished, well developed, awake and alert, appears uncomfortable Head: Normocephalic and atraumatic Eyes: Normal inspection, extraocular muscles intact, no conjunctival pallor Ear, nose, throat: Normal external exam Neck: Normal range of motion Respiratory: Patient is in no respiratory distress, lungs CTAB Cardiovascular: Patient is not tachycardic, RRR without murmur appreciated GI: Abd SNT with no guarding or rebound  Back: Normal inspection of the back with good strength and range of motion throughout all ext Extremities: pulses intact with good cap refills, no LE pitting edema or calf tenderness Neuro: The  patient is alert and oriented to person, place, and time, appropriately conversive, with 5/5 bilat UE/LE strength, no gross motor or sensory defects noted. Coordination appears to be adequate.  ED Results / Procedures / Treatments   Labs (all labs ordered are listed, but only abnormal results are displayed) Labs Reviewed  APTT - Abnormal; Notable for the following components:      Result Value   aPTT <22 (*)    All other components within normal limits  COMPREHENSIVE METABOLIC PANEL WITH GFR - Abnormal; Notable for the following components:   Glucose, Bld 188 (*)    Creatinine, Ser 1.12 (*)    Calcium  10.4 (*)    Total Protein 8.2 (*)    GFR, Estimated 52 (*)    All other components within normal limits  LIPID PANEL - Abnormal; Notable for the following components:   Triglycerides 159 (*)    LDL Cholesterol 111 (*)    All other components within normal limits  GLUCOSE, CAPILLARY - Abnormal; Notable for the following components:   Glucose-Capillary 172 (*)    All other components within normal limits  MRSA NEXT GEN BY PCR, NASAL  CBC WITH DIFFERENTIAL/PLATELET  PROTIME-INR  HEMOGLOBIN A1C  LACTIC ACID, PLASMA  CG4 I-STAT (LACTIC ACID)  POCT I-STAT CREATININE  POCT ACTIVATED CLOTTING TIME  POCT ACTIVATED CLOTTING TIME  POCT ACTIVATED CLOTTING TIME  TROPONIN T, HIGH SENSITIVITY  TROPONIN T, HIGH SENSITIVITY     EKG EKG and rhythm strip are interpreted by myself:   EKG: [Normal sinus rhythm] at heart rate of 92, normal QRS duration, QTc 455, patient has significant ST elevations in leads V1 and V2 and aVL with depressions in inferior leads EKG consistent with Acute STEMI Rhythm strip: Normal sinus rhythm in lead II   RADIOLOGY None    PROCEDURES:  Critical Care performed: No  .Critical Care  Performed by: Nicholaus Rolland BRAVO, MD Authorized by: Nicholaus Rolland BRAVO, MD   Critical care provider statement:    Critical care time (minutes):  31   Critical care was  necessary to treat or prevent imminent or life-threatening deterioration of the following conditions:  Circulatory failure   Critical care was time spent personally by me on the following activities:  Development of treatment plan with patient or surrogate, discussions with consultants, evaluation of patient's response to treatment, examination of patient, ordering and review of laboratory studies, ordering and review of radiographic studies, ordering and performing treatments and interventions, pulse oximetry, re-evaluation of patient's condition and review of old charts   Care discussed with: admitting provider   Comments:     STEMI ALERT, rapid assessement, frequent reassessments, heparin , communication with pharmacy and cardiology    MEDICATIONS ORDERED IN ED: Medications  0.9 %  sodium chloride  infusion (has no administration in time range)  Chlorhexidine  Gluconate Cloth 2 % PADS 6 each (6 each Topical Given 04/02/24 1507)  labetalol  (NORMODYNE ) injection 10 mg (has no administration in time range)  hydrALAZINE  (APRESOLINE ) injection 10 mg (has no administration in time range)  acetaminophen  (TYLENOL ) tablet 650 mg (has no administration in time range)  ondansetron  (ZOFRAN ) injection 4 mg (has no administration in time range)  enoxaparin  (LOVENOX ) injection 40 mg (has no administration in time range)  sodium chloride  flush (NS) 0.9 % injection 3 mL (has no administration in time range)  sodium chloride  flush (NS) 0.9 % injection 3 mL (has no administration in time range)  0.9 %  sodium chloride  infusion (has no administration in time range)  aspirin  chewable tablet 81 mg (has no administration in time range)  prasugrel  (EFFIENT ) tablet 10 mg (has no administration in time range)  carvedilol  (COREG ) tablet 3.125 mg (has no administration in time range)  insulin  aspart (novoLOG ) injection 0-15 Units (has no administration in time range)  insulin  aspart (novoLOG ) injection 0-5 Units (has no  administration in time range)  aspirin  chewable tablet 324 mg (324 mg Oral Given 04/02/24 1314)     IMPRESSION / MDM / ASSESSMENT AND PLAN / ED COURSE                                Differential diagnosis includes, but is not limited to, STEMI/ACS, electrolyte derangement anemia, less likely dissection   ED course: Patient presents acutely with 24 hours of chest pain.  EKG which was handed to me was consistent with an acute STEMI.  I immediately met the patient in triage and given her ongoing chest pain activated [facility] for STEMI alert.  Patient brought back and STEMI order set administered including ASA and heparin  as patient had no contraindications.  Dr. Mady at bedside and he will take the patient directly to the Cath Lab.  Of note all of the patient's blood work and imaging is pending.  I did consider all possible dissection however patient is neurovascularly intact with 2+ radial pulses and her blood pressure is not profoundly elevated making this less likely.    Clinical Course as of 04/02/24 1625  Tue Apr 02, 2024  1324 Dr. Mady going to the Cath lab [HD]    Clinical Course User Index [HD] Nicholaus Rolland BRAVO, MD   -- Risk: 5 This patient has a high risk of morbidity due to further diagnostic testing or treatment. Rationale: This patients evaluation and management involve a high risk of morbidity due to the potential severity of presenting symptoms, need for diagnostic testing, and/or initiation of treatment that may require close monitoring. The differential includes conditions with potential for significant deterioration or requiring escalation of care. Treatment decisions in the ED, including medication administration, procedural interventions, or disposition planning, reflect this level of risk. COPA: 5 The patient has the following acute or chronic illness/injury that poses a possible threat to life or bodily function: [X] : The patient has a potentially serious acute condition or  an acute exacerbation of a chronic illness requiring urgent evaluation and management in the Emergency Department. The clinical presentation necessitates immediate consideration of life-threatening or function-threatening diagnoses, even if they are ultimately ruled out.   FINAL CLINICAL IMPRESSION(S) / ED DIAGNOSES   Final diagnoses:  ST elevation myocardial infarction (STEMI), unspecified artery (HCC)  Chest pain, unspecified type     Rx / DC Orders   ED Discharge Orders          Ordered    AMB Referral to Cardiac Rehabilitation - Phase II        04/02/24 1435             Note:  This document was prepared using Dragon voice recognition software and may include unintentional dictation errors.   Nicholaus Rolland BRAVO, MD 04/02/24 1625  ADDENDUM FOR BILLING Patient given heparin , and consultant, meets CC time.       Nicholaus Rolland BRAVO, MD 04/09/24 1249  "

## 2024-04-02 NOTE — ED Notes (Signed)
 Pt taken to cath lab at 1324. Per MD End, hold heparin  at this time. Report given to cath lab RN.

## 2024-04-02 NOTE — H&P (Signed)
 " History and Physical    Carolyn Clark FMW:[id number] DOB: 1950-03-21 DOA: 04/02/2024  PCP: Tiffany Lamar Rush, MD (Confirm with patient/family/NH records and if not entered, this has to be entered at [facility] point of entry) Patient coming from: Home  I have personally briefly reviewed patient's old medical records in [facility]  Chief Complaint: Chest pain  HPI: Carolyn Clark is a 74 y.o. female with medical history significant of IIDM, HTN, HLD, presented with new onset of chest pain.  Patient presented with 10/10 crushing like chest pain, centrally located associated with lightheadedness and shortness of breath. ED Course: Afebrile, nontachycardic blood pressure 160/100 O2 saturation 98% room air.  EKG showed ST elevation in lead I and aVL, V1 and V2, troponin 4/18.  Blood work showed BUN 20 creatinine 1.1 glucose 188 bicarb 24 WBC 5.4.  Patient shifted to Cath Lab emergently, LHC found critical stenosis in the proximal and distal LAD and 2 stents placed in corresponding.  Patient reports significant improvement of chest pain afterwards.  Review of Systems: As per HPI otherwise 14 point review of systems negative.    Past Medical History:  Diagnosis Date   Hypercholesteremia    Hypertension    Renal disorder    kidney stone   Type 2 diabetes mellitus (HCC)     Past Surgical History:  Procedure Laterality Date   TUBAL LIGATION       reports that she has never smoked. She has never used smokeless tobacco. She reports that she does not drink alcohol. No history on file for drug use.  Allergies[1]  Family History  Problem Relation Age of Onset   Heart disease Mother    Heart disease Sister    Heart disease Sister      Prior to Admission medications  Medication Sig Start Date End Date Taking? Authorizing Provider  cyclobenzaprine  (FLEXERIL ) 10 MG tablet Take 1 tablet (10 mg total) by mouth 3 (three) times daily as needed. 06/25/19   Claudene Tanda POUR, PA-C  cyclobenzaprine   (FLEXERIL ) 5 MG tablet Take 1 tablet (5 mg total) by mouth 3 (three) times daily as needed for muscle spasms. Do not drive while taking this medication. 11/25/15   Sharman Sena LABOR, MD  ibuprofen  (ADVIL ) 600 MG tablet Take 1 tablet (600 mg total) by mouth every 8 (eight) hours as needed. 06/25/19   Claudene Tanda POUR, PA-C  ketorolac  (TORADOL ) 10 MG tablet Take 1 tablet (10 mg total) by mouth every 6 (six) hours as needed. 07/19/16   Cuthriell, Jonathan D, PA-C  phenazopyridine  (PYRIDIUM ) 95 MG tablet Take 1 tablet (95 mg total) by mouth 3 (three) times daily as needed for pain. 07/19/16   Cuthriell, Dorn BIRCH, PA-C  tamsulosin  (FLOMAX ) 0.4 MG CAPS capsule Take 1 capsule (0.4 mg total) by mouth daily. Take daily until symptoms resolve. 07/19/16   Ana Dorn BIRCH, PA-C    Physical Exam: Vitals:   04/02/24 1322 04/02/24 1326 04/02/24 1343 04/02/24 1501  BP:    (!) 168/104  Pulse:  92    Resp:  20  20  Temp:    (!) 97.5 F (36.4 C)  TempSrc:    Oral  SpO2:  97%  98%  Weight: 78.9 kg  78.9 kg 82.5 kg  Height:   5' 3.78 (1.62 m) 5' 4 (1.626 m)    Constitutional: NAD, calm, comfortable Vitals:   04/02/24 1322 04/02/24 1326 04/02/24 1343 04/02/24 1501  BP:    (!) 168/104  Pulse:  92    Resp:  20  20  Temp:    (!) 97.5 F (36.4 C)  TempSrc:    Oral  SpO2:  97%  98%  Weight: 78.9 kg  78.9 kg 82.5 kg  Height:   5' 3.78 (1.62 m) 5' 4 (1.626 m)   Eyes: PERRL, lids and conjunctivae normal ENMT: Mucous membranes are moist. Posterior pharynx clear of any exudate or lesions.Normal dentition.  Neck: normal, supple, no masses, no thyromegaly Respiratory: clear to auscultation bilaterally, no wheezing, no crackles. Normal respiratory effort. No accessory muscle use.  Cardiovascular: Regular rate and rhythm, no murmurs / rubs / gallops. No extremity edema. 2+ pedal pulses. No carotid bruits.  Abdomen: no tenderness, no masses palpated. No hepatosplenomegaly. Bowel sounds positive.   Musculoskeletal: no clubbing / cyanosis. No joint deformity upper and lower extremities. Good ROM, no contractures. Normal muscle tone.  Skin: no rashes, lesions, ulcers. No induration Neurologic: CN 2-12 grossly intact. Sensation intact, DTR normal. Strength 5/5 in all 4.  Psychiatric: Normal judgment and insight. Alert and oriented x 3. Normal mood.     Labs on Admission: I have personally reviewed following labs and imaging studies  CBC: Recent Labs  Lab 04/02/24 1313  WBC 5.4  NEUTROABS 2.1  HGB 13.2  HCT 41.3  MCV 91.0  PLT 249   Basic Metabolic Panel: Recent Labs  Lab 04/02/24 1313 04/02/24 1339  NA 141  --   K 3.9  --   CL 104  --   CO2 24  --   GLUCOSE 188*  --   BUN 20  --   CREATININE 1.12* 1.00  CALCIUM  10.4*  --    GFR: Estimated Creatinine Clearance: 52 mL/min (by C-G formula based on SCr of 1 mg/dL). Liver Function Tests: Recent Labs  Lab 04/02/24 1313  AST 24  ALT 17  ALKPHOS 83  BILITOT 0.4  PROT 8.2*  ALBUMIN 4.5   No results for input(s): LIPASE, AMYLASE in the last 168 hours. No results for input(s): AMMONIA in the last 168 hours. Coagulation Profile: Recent Labs  Lab 04/02/24 1313  INR 0.9   Cardiac Enzymes: No results for input(s): CKTOTAL, CKMB, CKMBINDEX, TROPONINI in the last 168 hours. BNP (last 3 results) No results for input(s): PROBNP in the last 8760 hours. HbA1C: No results for input(s): HGBA1C in the last 72 hours. CBG: No results for input(s): GLUCAP in the last 168 hours. Lipid Profile: Recent Labs    04/02/24 1330  CHOL 188  HDL 46  LDLCALC 111*  TRIG 159*  CHOLHDL 4.1   Thyroid Function Tests: No results for input(s): TSH, T4TOTAL, FREET4, T3FREE, THYROIDAB in the last 72 hours. Anemia Panel: No results for input(s): VITAMINB12, FOLATE, FERRITIN, TIBC, IRON, RETICCTPCT in the last 72 hours. Urine analysis:    Component Value Date/Time   COLORURINE YELLOW (A)  07/19/2016 1707   APPEARANCEUR CLEAR (A) 07/19/2016 1707   LABSPEC 1.011 07/19/2016 1707   PHURINE 6.0 07/19/2016 1707   GLUCOSEU NEGATIVE 07/19/2016 1707   HGBUR MODERATE (A) 07/19/2016 1707   BILIRUBINUR NEGATIVE 07/19/2016 1707   KETONESUR NEGATIVE 07/19/2016 1707   PROTEINUR NEGATIVE 07/19/2016 1707   NITRITE NEGATIVE 07/19/2016 1707   LEUKOCYTESUR NEGATIVE 07/19/2016 1707    Radiological Exams on Admission: No results found.  EKG: Independently reviewed.  ST elevation in lead I and aVL, V1 and V2  Assessment/Plan Principal Problem:   STEMI involving left anterior descending coronary artery [facility]) Active Problems:  ST elevation myocardial infarction involving left anterior descending (LAD) coronary artery (HCC)  (please populate well all problems here in Problem List. (For example, if patient is on BP meds at home and you resume or decide to hold them, it is a problem that needs to be her. 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 - As per cardiology, will continue heparin  drip, Effient   Acute HFrEF decompensation - Likely secondary to myocardial stunning from STEMI, received Lasix  in Cath Lab. - Chest x-ray -Reevaluate volume status tomorrow to decide further diuresis plan - Echocardiogram as per cardiology  IIDM -Agreed with SSI  Hyperlipidemia - Agreed with high potency statin  Obesity - BMI= 31 - Calorie control recommended  DVT prophylaxis: Heparin  drip Code Status: Full code Family Communication: None at bedside Disposition Plan: Patient is sick with STEMI requiring emergency cardiac catheterization, expect more than 2 midnight hospital stay. Consults called: Cardiology Admission status: ICU admission   Cort ONEIDA Mana MD [facility] Pager [phone]  04/02/2024, 3:13 PM       [1] No Known Allergies  "

## 2024-04-02 NOTE — Consult Note (Incomplete)
 PHARMACY - ANTICOAGULATION CONSULT NOTE  Pharmacy Consult for heparin  dosing Indication: chest pain/ACS  Allergies[1]  Patient Measurements: Weight: 78.9 kg (173 lb 15.1 oz)  Vital Signs:    Labs: No results for input(s): HGB, HCT, PLT, APTT, LABPROT, INR, HEPARINUNFRC, HEPRLOWMOCWT, CREATININE, CKTOTAL, CKMB, TROPONINIHS in the last 72 hours.  CrCl cannot be calculated (Patient's most recent lab result is older than the maximum 21 days allowed.).   Medical History: Past Medical History:  Diagnosis Date   Hypercholesteremia    Hypertension    Renal disorder    kidney stone   Type 2 diabetes mellitus (HCC)     Medications:  No PTA anticoagulation  Assessment: *** Carolyn Clark is a 34 yoF presenting with concerns for ACS/STEMI.  On arrival to ED, unconfirmed EKG concerning for probable infarct, STEMI. Code STEMI called and patient taken to cath lab. EK  Baseline hgb 13.2, plt 249  Goal of Therapy:  Heparin  level 0.3-0.7 units/ml Monitor platelets by anticoagulation protocol: Yes   Plan:  Give 4000 units bolus x 1, and start heparin  infusion at 850 units/hr Check anti-Xa level in 8 hours and daily while on heparin  Continue to monitor H&H and platelets  Carolyn Clark Carolyn Clark 04/02/2024,1:36 PM      [1] No Known Allergies

## 2024-04-02 NOTE — ED Notes (Signed)
 Cardiology MD End at bedside.

## 2024-04-02 NOTE — ED Notes (Signed)
 Pt taken to cath lab before BP was able to be documented. VS obtained upon MD End arrival.

## 2024-04-03 ENCOUNTER — Inpatient Hospital Stay: Admit: 2024-04-03 | Discharge: 2024-04-03 | Disposition: A | Attending: Internal Medicine | Admitting: Internal Medicine

## 2024-04-03 ENCOUNTER — Encounter: Payer: Self-pay | Admitting: Internal Medicine

## 2024-04-03 DIAGNOSIS — I2102 ST elevation (STEMI) myocardial infarction involving left anterior descending coronary artery: Principal | ICD-10-CM

## 2024-04-03 DIAGNOSIS — E782 Mixed hyperlipidemia: Secondary | ICD-10-CM

## 2024-04-03 DIAGNOSIS — I213 ST elevation (STEMI) myocardial infarction of unspecified site: Secondary | ICD-10-CM

## 2024-04-03 DIAGNOSIS — R079 Chest pain, unspecified: Secondary | ICD-10-CM

## 2024-04-03 DIAGNOSIS — I255 Ischemic cardiomyopathy: Secondary | ICD-10-CM

## 2024-04-03 LAB — BASIC METABOLIC PANEL WITH GFR
Anion gap: 14 (ref 5–15)
BUN: 22 mg/dL (ref 8–23)
CO2: 23 mmol/L (ref 22–32)
Calcium: 9.5 mg/dL (ref 8.9–10.3)
Chloride: 99 mmol/L (ref 98–111)
Creatinine, Ser: 1.06 mg/dL — ABNORMAL HIGH (ref 0.44–1.00)
GFR, Estimated: 55 mL/min — ABNORMAL LOW
Glucose, Bld: 183 mg/dL — ABNORMAL HIGH (ref 70–99)
Potassium: 3.8 mmol/L (ref 3.5–5.1)
Sodium: 136 mmol/L (ref 135–145)

## 2024-04-03 LAB — ECHOCARDIOGRAM COMPLETE
AR max vel: 2.02 cm2
AV Area VTI: 2.28 cm2
AV Area mean vel: 1.96 cm2
AV Mean grad: 3 mmHg
AV Peak grad: 5.5 mmHg
Ao pk vel: 1.17 m/s
Area-P 1/2: 5.42 cm2
Calc EF: 51.2 %
Height: 64 in
S' Lateral: 2.1 cm
Single Plane A2C EF: 53.9 %
Single Plane A4C EF: 52.3 %
Weight: 2910.07 [oz_av]

## 2024-04-03 LAB — GLUCOSE, CAPILLARY
Glucose-Capillary: 162 mg/dL — ABNORMAL HIGH (ref 70–99)
Glucose-Capillary: 165 mg/dL — ABNORMAL HIGH (ref 70–99)
Glucose-Capillary: 111 mg/dL — ABNORMAL HIGH (ref 70–99)
Glucose-Capillary: 148 mg/dL — ABNORMAL HIGH (ref 70–99)

## 2024-04-03 LAB — CBC
HCT: 38.9 % (ref 36.0–46.0)
Hemoglobin: 12.8 g/dL (ref 12.0–15.0)
MCH: 29.4 pg (ref 26.0–34.0)
MCHC: 32.9 g/dL (ref 30.0–36.0)
MCV: 89.2 fL (ref 80.0–100.0)
Platelets: 248 10*3/uL (ref 150–400)
RBC: 4.36 MIL/uL (ref 3.87–5.11)
RDW: 13.5 % (ref 11.5–15.5)
WBC: 7.5 10*3/uL (ref 4.0–10.5)
nRBC: 0 % (ref 0.0–0.2)

## 2024-04-03 MED ORDER — LOSARTAN POTASSIUM 25 MG PO TABS
12.5000 mg | ORAL_TABLET | Freq: Every day | ORAL | Status: DC
  Administered 2024-04-03: 12.5 mg via ORAL
  Filled 2024-04-03 (×2): qty 0.5

## 2024-04-03 MED ORDER — EZETIMIBE 10 MG PO TABS: 10.0000 mg | ORAL_TABLET | Freq: Every day | ORAL | Status: DC

## 2024-04-03 MED ORDER — PERFLUTREN LIPID MICROSPHERE
1.0000 mL | INTRAVENOUS | Status: AC | PRN
  Administered 2024-04-03: 2 mL via INTRAVENOUS

## 2024-04-03 NOTE — TOC Initial Note (Signed)
 Transition of Care [facility]) - Initial/Assessment Note    Patient Details  Name: Carolyn Clark MRN: [id number] Date of Birth: [birth year]  [facility]) CM/SW Contact:    Corrie JINNY Ruts, LCSW Phone Number: 04/03/2024, 1:34 PM  Clinical Narrative:                 Chart reviewed. SW was able to speak with the patient and family members at bedside today. I introduced myself, my role, and reason for visit.   The patient PCP is Lamar Norleen Flock. The patient reports that she lives in the home with her daughter Brittany. The patient reports that she is able to complete task independently in the home. The patient reports that she drive herself to medical appointments. The patient reports that her family will assist her during D/C. The patient reports that she uses [facility]. The patient reports that she has never had HH or been admitted in a SNF in the past. The patient reports that she has no equipment in the home.   SW inquired about [facility] and SNF placement if recommended. The patient was only accepting of HH if recommended.   There are no ICM needs at this time.     Barriers to Discharge: Continued Medical Work up   Patient Goals and CMS Choice            Expected Discharge Plan and Services                                              Prior Living Arrangements/Services   Lives with:: Adult Children Patient language and need for interpreter reviewed:: Yes        Need for Family Participation in Patient Care: Yes (Comment)     Criminal Activity/Legal Involvement Pertinent to Current Situation/Hospitalization: No - Comment as needed  Activities of Daily Living   ADL Screening (condition at time of admission) Independently performs ADLs?: Yes (appropriate for developmental age) Is the patient deaf or have difficulty hearing?: No Does the patient have difficulty seeing, even when wearing glasses/contacts?: No Does the patient have difficulty concentrating,  remembering, or making decisions?: No  Permission Sought/Granted                  Emotional Assessment Appearance:: Appears stated age Attitude/Demeanor/Rapport: Gracious Affect (typically observed): Calm Orientation: : Oriented to Place, Oriented to Self, Oriented to Situation, Oriented to  Time Alcohol / Substance Use: Not Applicable    Admission diagnosis:  STEMI involving left anterior descending coronary artery (HCC) [I21.02] Patient Active Problem List   Diagnosis Date Noted   Chest pain 04/03/2024   Mixed hyperlipidemia 04/03/2024   Ischemic cardiomyopathy 04/03/2024   ST elevation myocardial infarction involving left anterior descending (LAD) coronary artery (HCC) 04/02/2024   ST elevation myocardial infarction (STEMI) (HCC) 04/02/2024   PCP:  Flock Lamar Norleen, MD Pharmacy:   [facility] [address], [place] - [address] [place] [address] Phone: [phone] Fax: [phone]     Social Drivers of Health (SDOH) Social History: SDOH Screenings   Food Insecurity: No Food Insecurity (04/02/2024)  Housing: Low Risk (04/02/2024)  Transportation Needs: No Transportation Needs (04/02/2024)  Utilities: Not At Risk (04/02/2024)  Financial Resource Strain: Low Risk (01/19/2023)   Received from [facility]  Physical Activity: Insufficiently Active (03/02/2022)  Received from [facility]  Social Connections: Moderately Isolated (04/02/2024)  Stress: No Stress Concern Present (03/02/2022)   Received from [facility]  Tobacco Use: Low Risk (04/02/2024)  Health Literacy: Low Risk (01/19/2023)   Received from [facility]   SDOH Interventions: Housing Interventions: Patient Declined   Readmission Risk Interventions     No data to display

## 2024-04-03 NOTE — Plan of Care (Signed)

## 2024-04-03 NOTE — Plan of Care (Signed)
  Problem: Education: Goal: Knowledge of General Education information will improve Description: Including pain rating scale, medication(s)/side effects and non-pharmacologic comfort measures Outcome: Progressing   Problem: Health Behavior/Discharge Planning: Goal: Ability to manage health-related needs will improve Outcome: Progressing   Problem: Activity: Goal: Risk for activity intolerance will decrease Outcome: Progressing   Problem: Coping: Goal: Level of anxiety will decrease Outcome: Progressing   Problem: Safety: Goal: Ability to remain free from injury will improve Outcome: Progressing   Problem: Skin Integrity: Goal: Risk for impaired skin integrity will decrease Outcome: Progressing

## 2024-04-03 NOTE — Progress Notes (Signed)
 "  Rounding Note   Patient Name: Carolyn Clark Date of Encounter: 04/03/2024  [facility] Cardiologist: Dr. Lonni End, [facility]  Subjective Sitting in bed, daughter at the bedside Reports feeling well, no complaints Discussed recent events, developed left arm pain, central chest pain yesterday, daughter drove her to the emergency room - Taken to the Cath Lab urgently for STEMI LAD 100% occluded Stent x 2 placed to the LAD  Prelim echo read with anterior wall hypokinesis ejection fraction 40%  Scheduled Meds:  aspirin   81 mg Oral Daily   atorvastatin   80 mg Oral Daily   carvedilol   3.125 mg Oral BID WC   Chlorhexidine  Gluconate Cloth  6 each Topical Q0600   enoxaparin  (LOVENOX ) injection  40 mg Subcutaneous Q24H   ezetimibe   10 mg Oral Daily   insulin  aspart  0-15 Units Subcutaneous TID WC   insulin  aspart  0-5 Units Subcutaneous QHS   losartan   12.5 mg Oral Daily   prasugrel   10 mg Oral Daily   sodium chloride  flush  3 mL Intravenous Q12H   Continuous Infusions:  sodium chloride  10 mL/hr at 04/03/24 0500   sodium chloride      PRN Meds: sodium chloride , acetaminophen , ondansetron  (ZOFRAN ) IV, mouth rinse, sodium chloride  flush   Vital Signs  Vitals:   04/03/24 0800 04/03/24 0900 04/03/24 1000 04/03/24 1100  BP: 110/75 110/61 95/63 101/71  Pulse: 93 92 88 86  Resp: 18 20 18 15   Temp: 98.1 F (36.7 C)     TempSrc:      SpO2: 99% 96% 94% 97%  Weight:      Height:        Intake/Output Summary (Last 24 hours) at 04/03/2024 1139 Last data filed at 04/03/2024 0500 Gross per 24 hour  Intake 140.86 ml  Output 2500 ml  Net -2359.14 ml      04/02/2024    3:01 PM 04/02/2024    1:43 PM 04/02/2024    1:22 PM  Last 3 Weights  Weight (lbs) 181 lb 14.1 oz 173 lb 15.1 oz 173 lb 15.1 oz  Weight (kg) 82.5 kg 78.9 kg 78.9 kg      Telemetry Normal sinus rhythm- Personally Reviewed  ECG   - Personally Reviewed  Physical Exam  GEN: No acute distress.    Neck: No JVD Cardiac: RRR, no murmurs, rubs, or gallops.  Respiratory: Clear to auscultation bilaterally. GI: Soft, nontender, non-distended  MS: No edema; No deformity. Neuro:  Nonfocal  Psych: Normal affect   Labs High Sensitivity Troponin:  No results for input(s): TROPONINIHS in the last 720 hours.  Recent Labs  Lab 04/02/24 1313 04/02/24 1554  TRNPT 13 892*       Chemistry Recent Labs  Lab 04/02/24 1313 04/02/24 1339 04/03/24 0416  NA 141  --  136  K 3.9  --  3.8  CL 104  --  99  CO2 24  --  23  GLUCOSE 188*  --  183*  BUN 20  --  22  CREATININE 1.12* 1.00 1.06*  CALCIUM  10.4*  --  9.5  PROT 8.2*  --   --   ALBUMIN 4.5  --   --   AST 24  --   --   ALT 17  --   --   ALKPHOS 83  --   --   BILITOT 0.4  --   --   GFRNONAA 52*  --  55*  ANIONGAP 13  --  14    Lipids  Recent Labs  Lab 04/02/24 1330  CHOL 188  TRIG 159*  HDL 46  LDLCALC 111*  CHOLHDL 4.1    Hematology Recent Labs  Lab 04/02/24 1313 04/03/24 0416  WBC 5.4 7.5  RBC 4.54 4.36  HGB 13.2 12.8  HCT 41.3 38.9  MCV 91.0 89.2  MCH 29.1 29.4  MCHC 32.0 32.9  RDW 13.5 13.5  PLT 249 248   Thyroid No results for input(s): TSH, FREET4 in the last 168 hours.  BNPNo results for input(s): BNP, PROBNP in the last 168 hours.  DDimer No results for input(s): DDIMER in the last 168 hours.   Radiology  DG Chest 1 View Result Date: 04/02/2024 EXAM: 1 VIEW XRAY OF THE CHEST 04/02/2024 03:41:00 PM COMPARISON: 06/25/2019 CLINICAL HISTORY: Congestive heart failure. ICD10: [id number] Congestive heart failure. FINDINGS: LUNGS AND PLEURA: No focal pulmonary opacity. No pleural effusion. No pneumothorax. HEART AND MEDIASTINUM: No acute abnormality of the cardiac and mediastinal silhouettes. BONES AND SOFT TISSUES: No acute osseous abnormality. IMPRESSION: 1. No acute cardiopulmonary abnormality. Electronically signed by: Oneil Devonshire MD 04/02/2024 10:34 PM EST RP Workstation: [id number]   CARDIAC  CATHETERIZATION Result Date: 04/02/2024   Mid LAD lesion is 100% stenosed.   Mid LAD to Dist LAD lesion is 85% stenosed.   A drug-eluting stent was successfully placed using a STENT ONYX FRONTIER 2.75X15.   A drug-eluting stent was successfully placed using a STENT ONYX FRONTIER 2.25X15.   Post intervention, there is a 0% residual stenosis.   Post intervention, there is a 0% residual stenosis.   There is moderate to severe left ventricular systolic dysfunction.   LV end diastolic pressure is moderately elevated.   There is no aortic valve stenosis.   In the absence of any other complications or medical issues, we expect the patient to be ready for discharge from an interventional cardiology perspective on 04/04/2024.   Recommend uninterrupted dual antiplatelet therapy with Aspirin  81mg  daily and Prasugrel  10mg  daily for a minimum of 12 months (ACS-Class I recommendation). Conclusions: Severe single-vessel coronary artery disease with thrombotic occlusion of mid LAD followed by 80-90% mid/distal LAD stenosis.  Mild, nonobstructive coronary artery disease noted in LCx and RCA. Moderately elevated left ventricular filling pressure (LVEDP 25 mmHg). Moderately-severely reduced left ventricular systolic function (LVEF 30-35% with mid/apical anterior and inferior hypokinesis/akinesis). Successful PCI to mid and mid/distal LAD with non-overlapping Onyx Frontier 2.75 x 15 mm (proximal) and 2.25 x 15 mm (distal) drug-eluting stents with 0% residual stenosis and TIMI-3 flow.  Recommendations: Admit to ICU for post-STEMI care. Dual antiplatelet therapy with aspirin  and prasugrel  for at least 12 months. Aggressive secondary prevention of coronary artery disease. Obtain echocardiogram. Gentle diuresis and initiation of goal-directed medical therapy for acute HFrEF due to ischemic cardiomyopathy, as tolerated. Lonni Hanson, MD [facility]   Cardiac Studies Prelim echo with ejection fraction 40%, anterior wall  hypokinesis  Patient Profile   Carolyn Clark is a 74 y.o. female with a hx of hypertension, hyperlipidemia, and type 2 diabetes mellitus, who is being seen 04/02/2024 for the evaluation of angina, STEMI, stent x 2 to the LAD  Assessment & Plan  STEMI Severe single-vessel coronary artery disease with thrombotic occlusion of mid LAD followed by 80-90% mid/distal LAD stenosis.  Mild, nonobstructive coronary artery disease noted in LCx and RCA. -- Stent x 2 placed to the LAD -non-overlapping Onyx Frontier 2.75 x 15 mm (proximal) and 2.25 x 15 mm (distal) drug-eluting stents  with 0% residual stenosis and TIMI-3 flow.  - On aspirin , Effient  - Lipitor  80 with Zetia  losartan  12.5 daily and Coreg  3.125 twice daily Would continue to monitor additional 24 hours given large infarct --Prelim echo 40% with anterior wall hypokinesis  Cardiomyopathy Anterior wall hypokinesis -Prelim echo 40% - Continue Coreg  3.125 twice daily, start losartan  12.5 daily  Hyperlipidemia Lipitor  80, Zetia  10 daily, goal LDL less than 55   For questions or updates, please contact [facility] Please consult [email or web address] for contact info under   Signed, Jensen Cheramie, MD  04/03/2024, 11:39 AM    "

## 2024-04-03 NOTE — Progress Notes (Addendum)
 "    Progress Note    Carolyn Clark  FMW:[id number] DOB: Sep 01, 1950  DOA: 04/02/2024 PCP: Tiffany Lamar Rush, MD      Brief Narrative:    Medical records reviewed and are as summarized below:  Carolyn Clark is a 74 y.o. female with medical history significant with type II DM, hypertension, hyperlipidemia, who presented to the hospital with severe chest pain.  She was found to have acute STEMI.  Patient was emergently taken to the Cath Lab for left heart cath.  She was found to have severe single-vessel CAD with thrombotic occlusion of mid LAD followed by 80 to 90% mid/distal LAD stenosis, nonobstructive CAD in left circumflex and RCA, moderately elevated LVEDP (25 mmHg), LVEF 30 to 35%. Successful PCI to mid and mid/distal LAD with stents was performed.  Patient was admitted to stepdown unit for further management.    Assessment/Plan:   Principal Problem:   STEMI involving left anterior descending coronary artery (HCC) Active Problems:   ST elevation myocardial infarction involving left anterior descending (LAD) coronary artery (HCC)   Body mass index is 31.22 kg/m.  (Class I obesity)   Acute STEMI: S/p left heart cath with successful PCI to mid and mid/distal LAD with stents.  Continue aspirin  and Effient .  She has also been started on carvedilol . EF 30 to 35% by left heart cath. 2D echo is pending Follow-up with cardiologist.   Hyperlipidemia: Continue ezetimibe  and atorvastatin . LDL 111   Type II DM: NovoLog  as needed for hyperglycemia. She takes glipizide at home. Hemoglobin A1c 8.3   Hypertension: Continue carvedilol  and losartan . HCTZ on hold   Transfer from stepdown unit to progressive care unit.   Discussed risks and benefits of all new medications including antiplatelets with patient and Brittany, daughter, at the bedside.   Diet Order             Diet Carb Modified Room service appropriate? Yes  Diet effective now                                   Consultants: Cardiologist  Procedures: Left heart cath with 2 stents to LAD    Medications:    aspirin   81 mg Oral Daily   atorvastatin   80 mg Oral Daily   carvedilol   3.125 mg Oral BID WC   Chlorhexidine  Gluconate Cloth  6 each Topical Q0600   enoxaparin  (LOVENOX ) injection  40 mg Subcutaneous Q24H   ezetimibe   10 mg Oral Daily   insulin  aspart  0-15 Units Subcutaneous TID WC   insulin  aspart  0-5 Units Subcutaneous QHS   losartan   12.5 mg Oral Daily   prasugrel   10 mg Oral Daily   sodium chloride  flush  3 mL Intravenous Q12H   Continuous Infusions:  sodium chloride  10 mL/hr at 04/03/24 0500   sodium chloride        Anti-infectives (From admission, onward)    None              Family Communication/Anticipated D/C date and plan/Code Status   DVT prophylaxis: enoxaparin  (LOVENOX ) injection 40 mg Start: 04/03/24 0800     Code Status: Full Code  Family Communication: Plan discussed with Brittany, daughter, at the bedside Disposition Plan: Plan to discharge home   Status is: Inpatient Remains inpatient appropriate because: Acute STEMI       Subjective:   Doing events noted.  No chest pain  no shortness of breath.  She feels better.  Brittney, daughter, at the bedside  Objective:    Vitals:   04/03/24 0800 04/03/24 0900 04/03/24 1000 04/03/24 1100  BP: 110/75 110/61 95/63 101/71  Pulse: 93 92 88 86  Resp: 18 20 18 15   Temp: 98.1 F (36.7 C)     TempSrc:      SpO2: 99% 96% 94% 97%  Weight:      Height:       No data found.   Intake/Output Summary (Last 24 hours) at 04/03/2024 1134 Last data filed at 04/03/2024 0500 Gross per 24 hour  Intake 140.86 ml  Output 2500 ml  Net -2359.14 ml   Filed Weights   04/02/24 1322 04/02/24 1343 04/02/24 1501  Weight: 78.9 kg 78.9 kg 82.5 kg    Exam:  GEN: NAD SKIN: Warm and dry EYES: No pallor or icterus ENT: MMM CV: RRR PULM: CTA B ABD: soft, obese, NT,  +BS CNS: AAO x 3, non focal EXT: No edema or tenderness        Data Reviewed:   I have personally reviewed following labs and imaging studies:  Labs: Labs show the following:   Basic Metabolic Panel: Recent Labs  Lab 04/02/24 1313 04/02/24 1339 04/03/24 0416  NA 141  --  136  K 3.9  --  3.8  CL 104  --  99  CO2 24  --  23  GLUCOSE 188*  --  183*  BUN 20  --  22  CREATININE 1.12* 1.00 1.06*  CALCIUM  10.4*  --  9.5   GFR Estimated Creatinine Clearance: 49.1 mL/min (A) (by C-G formula based on SCr of 1.06 mg/dL (H)). Liver Function Tests: Recent Labs  Lab 04/02/24 1313  AST 24  ALT 17  ALKPHOS 83  BILITOT 0.4  PROT 8.2*  ALBUMIN 4.5   No results for input(s): LIPASE, AMYLASE in the last 168 hours. No results for input(s): AMMONIA in the last 168 hours. Coagulation profile Recent Labs  Lab 04/02/24 1313  INR 0.9    CBC: Recent Labs  Lab 04/02/24 1313 04/03/24 0416  WBC 5.4 7.5  NEUTROABS 2.1  --   HGB 13.2 12.8  HCT 41.3 38.9  MCV 91.0 89.2  PLT 249 248   Cardiac Enzymes: No results for input(s): CKTOTAL, CKMB, CKMBINDEX, TROPONINI in the last 168 hours. BNP (last 3 results) No results for input(s): PROBNP in the last 8760 hours. CBG: Recent Labs  Lab 04/02/24 1611 04/02/24 2117 04/03/24 0803  GLUCAP 172* 177* 162*   D-Dimer: No results for input(s): DDIMER in the last 72 hours. Hgb A1c: Recent Labs    04/02/24 1330  HGBA1C 8.3*   Lipid Profile: Recent Labs    04/02/24 1330  CHOL 188  HDL 46  LDLCALC 111*  TRIG 159*  CHOLHDL 4.1   Thyroid function studies: No results for input(s): TSH, T4TOTAL, T3FREE, THYROIDAB in the last 72 hours.  Invalid input(s): FREET3 Anemia work up: No results for input(s): VITAMINB12, FOLATE, FERRITIN, TIBC, IRON, RETICCTPCT in the last 72 hours. Sepsis Labs: Recent Labs  Lab 04/02/24 1313 04/02/24 1339 04/02/24 1554 04/03/24 0416  WBC 5.4  --   --   7.5  LATICACIDVEN  --  1.5 2.9*  --     Microbiology Recent Results (from the past 240 hours)  MRSA Next Gen by PCR, Nasal     Status: None   Collection Time: 04/02/24  2:32 PM   Specimen: Nasal  Mucosa; Nasal Swab  Result Value Ref Range Status   MRSA by PCR Next Gen NOT DETECTED NOT DETECTED Final    Comment: (NOTE) The GeneXpert MRSA Assay (FDA approved for NASAL specimens only), is one component of a comprehensive MRSA colonization surveillance program. It is not intended to diagnose MRSA infection nor to guide or monitor treatment for MRSA infections. Test performance is not FDA approved in patients less than 22 years old. Performed at [facility], [address] [place], [place] [address]     Procedures and diagnostic studies:  DG Chest 1 View Result Date: 04/02/2024 EXAM: 1 VIEW XRAY OF THE CHEST 04/02/2024 03:41:00 PM COMPARISON: 06/25/2019 CLINICAL HISTORY: Congestive heart failure. ICD10: [id number] Congestive heart failure. FINDINGS: LUNGS AND PLEURA: No focal pulmonary opacity. No pleural effusion. No pneumothorax. HEART AND MEDIASTINUM: No acute abnormality of the cardiac and mediastinal silhouettes. BONES AND SOFT TISSUES: No acute osseous abnormality. IMPRESSION: 1. No acute cardiopulmonary abnormality. Electronically signed by: Oneil Devonshire MD 04/02/2024 10:34 PM EST RP Workstation: [id number]   CARDIAC CATHETERIZATION Result Date: 04/02/2024   Mid LAD lesion is 100% stenosed.   Mid LAD to Dist LAD lesion is 85% stenosed.   A drug-eluting stent was successfully placed using a STENT ONYX FRONTIER 2.75X15.   A drug-eluting stent was successfully placed using a STENT ONYX FRONTIER 2.25X15.   Post intervention, there is a 0% residual stenosis.   Post intervention, there is a 0% residual stenosis.   There is moderate to severe left ventricular systolic dysfunction.   LV end diastolic pressure is moderately elevated.   There is no aortic valve stenosis.   In the absence of  any other complications or medical issues, we expect the patient to be ready for discharge from an interventional cardiology perspective on 04/04/2024.   Recommend uninterrupted dual antiplatelet therapy with Aspirin  81mg  daily and Prasugrel  10mg  daily for a minimum of 12 months (ACS-Class I recommendation). Conclusions: Severe single-vessel coronary artery disease with thrombotic occlusion of mid LAD followed by 80-90% mid/distal LAD stenosis.  Mild, nonobstructive coronary artery disease noted in LCx and RCA. Moderately elevated left ventricular filling pressure (LVEDP 25 mmHg). Moderately-severely reduced left ventricular systolic function (LVEF 30-35% with mid/apical anterior and inferior hypokinesis/akinesis). Successful PCI to mid and mid/distal LAD with non-overlapping Onyx Frontier 2.75 x 15 mm (proximal) and 2.25 x 15 mm (distal) drug-eluting stents with 0% residual stenosis and TIMI-3 flow.  Recommendations: Admit to ICU for post-STEMI care. Dual antiplatelet therapy with aspirin  and prasugrel  for at least 12 months. Aggressive secondary prevention of coronary artery disease. Obtain echocardiogram. Gentle diuresis and initiation of goal-directed medical therapy for acute HFrEF due to ischemic cardiomyopathy, as tolerated. Lonni Hanson, MD [facility]              LOS: 1 day   Lizza Huffaker  [facility]   Pager on [email or web address]. If 7PM-7AM, please contact night-coverage at [email or web address]     04/03/2024, 11:34 AM           "

## 2024-04-04 ENCOUNTER — Other Ambulatory Visit: Payer: Self-pay

## 2024-04-04 DIAGNOSIS — E1159 Type 2 diabetes mellitus with other circulatory complications: Secondary | ICD-10-CM

## 2024-04-04 DIAGNOSIS — E119 Type 2 diabetes mellitus without complications: Secondary | ICD-10-CM

## 2024-04-04 LAB — GLUCOSE, CAPILLARY: Glucose-Capillary: 126 mg/dL — ABNORMAL HIGH (ref 70–99)

## 2024-04-04 MED ORDER — PRASUGREL HCL 10 MG PO TABS
10.0000 mg | ORAL_TABLET | Freq: Every day | ORAL | 0 refills | Status: AC
  Filled 2024-04-04: qty 30, 30d supply, fill #0

## 2024-04-04 MED ORDER — ASPIRIN 81 MG PO CHEW: 81.0000 mg | CHEWABLE_TABLET | Freq: Every day | ORAL | Status: AC

## 2024-04-04 MED ORDER — CARVEDILOL 3.125 MG PO TABS
3.1250 mg | ORAL_TABLET | Freq: Two times a day (BID) | ORAL | 0 refills | Status: AC
  Filled 2024-04-04: qty 60, 30d supply, fill #0

## 2024-04-04 MED ORDER — ATORVASTATIN CALCIUM 80 MG PO TABS
80.0000 mg | ORAL_TABLET | Freq: Every evening | ORAL | 0 refills | Status: AC
  Filled 2024-04-04: qty 30, 30d supply, fill #0

## 2024-04-04 MED ORDER — EZETIMIBE 10 MG PO TABS
10.0000 mg | ORAL_TABLET | Freq: Every day | ORAL | 0 refills | Status: AC
  Filled 2024-04-04: qty 30, 30d supply, fill #0

## 2024-04-04 MED ORDER — LOSARTAN POTASSIUM 25 MG PO TABS
12.5000 mg | ORAL_TABLET | Freq: Every day | ORAL | 0 refills | Status: AC
  Filled 2024-04-04: qty 30, 60d supply, fill #0

## 2024-04-04 NOTE — Discharge Summary (Signed)
 " Physician Discharge Summary   Patient: Carolyn Clark MRN: [id number] DOB: 1950/07/27  Admit date:     04/02/2024  Discharge date: 04/04/24  Discharge Physician: AIDA CHO   PCP: Tiffany Lamar Rush, MD   Recommendations at discharge:   Follow-up with PCP in 1 to 2 weeks Follow-up with cardiologist in 1 week.  Office will schedule appointment).  Discharge Diagnoses: Principal Problem:   ST elevation myocardial infarction (STEMI) (HCC) Active Problems:   ST elevation myocardial infarction involving left anterior descending (LAD) coronary artery (HCC)   Chest pain   Mixed hyperlipidemia   Ischemic cardiomyopathy   Type 2 diabetes mellitus (HCC)  Resolved Problems:   * No resolved hospital problems. [facility] Course:   Carolyn Clark is a 74 y.o. female with medical history significant with type II DM, hypertension, hyperlipidemia, who presented to the hospital with severe chest pain.  She was found to have acute STEMI.   Patient was emergently taken to the Cath Lab for left heart cath.  She was found to have severe single-vessel CAD with thrombotic occlusion of mid LAD followed by 80 to 90% mid/distal LAD stenosis, nonobstructive CAD in left circumflex and RCA, moderately elevated LVEDP (25 mmHg), LVEF 30 to 35%. Successful PCI to mid and mid/distal LAD with stents was performed.  Patient was admitted to stepdown unit for further management.   Assessment and Plan:  Acute STEMI: S/p left heart cath with successful PCI to mid and mid/distal LAD with stents.  Continue aspirin  and Effient .  Continue Carvedilol  and Losartan  EF 30 to 35% by left heart cath. 2D echo noted EF estimated at 40 to 45%.  Lipid panel please abdominal have fine to go back to sleep Follow-up with cardiologist.     Hyperlipidemia: Continue ezetimibe  and atorvastatin . LDL 111     Type II DM: Continue glipizide at discharge. Hemoglobin A1c 8.3     Hypertension: Continue carvedilol  and  losartan . HCTZ-triamterene discontinued     Her condition has improved and she is deemed stable for discharge to home today.  Discharge plan was discussed with patient and her daughter at the bedside.   Discharge plan was also discussed with Dr. Gollan, cardiologist.              Consultants: Cardiologist Procedures performed: PCI with 2 stents to LAD Disposition: Home Diet recommendation:  Discharge Diet Orders (From admission, onward)     Start     Ordered   04/04/24 0000  Diet - low sodium heart healthy        04/04/24 0922   04/04/24 0000  Diet Carb Modified        04/04/24 0922           Cardiac and Carb modified diet DISCHARGE MEDICATION: Allergies as of 04/04/2024       Reactions   Amlodipine    Possible hair loss?   Lisinopril    cough   Metformin    Abdominal pain   Simvastatin Other (See Comments)   Joint pain        Medication List     STOP taking these medications    ibuprofen  200 MG tablet Commonly known as: ADVIL    triamterene-hydrochlorothiazide 37.5-25 MG capsule Commonly known as: DYAZIDE       TAKE these medications    aspirin  81 MG chewable tablet Chew 1 tablet (81 mg total) by mouth daily.   atorvastatin  80 MG tablet Commonly known as: LIPITOR  Take 1 tablet (  80 mg total) by mouth at bedtime. What changed:  medication strength how much to take   carvedilol  3.125 MG tablet Commonly known as: COREG  Take 1 tablet (3.125 mg total) by mouth 2 (two) times daily with a meal.   cyclobenzaprine  5 MG tablet Commonly known as: FLEXERIL  Take 1 tablet (5 mg total) by mouth 3 (three) times daily as needed for muscle spasms. Do not drive while taking this medication.   ezetimibe  10 MG tablet Commonly known as: ZETIA  Take 1 tablet (10 mg total) by mouth daily.   glipiZIDE 10 MG 24 hr tablet Commonly known as: GLUCOTROL XL Take 20 mg by mouth daily.   losartan  25 MG tablet Commonly known as: COZAAR  Take 0.5 tablets (12.5  mg total) by mouth daily. What changed:  medication strength how much to take   prasugrel  10 MG Tabs tablet Commonly known as: EFFIENT  Take 1 tablet (10 mg total) by mouth daily.        Follow-up Information     [facility]. Schedule an appointment as soon as possible for a visit in 1 week(s).   Specialty: Cardiology Contact information: [address] [place]  [phone]               Discharge Exam: Carolyn Clark   04/02/24 1322 04/02/24 1343 04/02/24 1501  Weight: 78.9 kg 78.9 kg 82.5 kg   GEN: NAD SKIN: Warm and dry EYES: No pallor or icterus ENT: MMM CV: RRR PULM: CTA B ABD: soft, ND, NT, +BS CNS: AAO x 3, non focal EXT: No edema or tenderness   Condition at discharge: good  The results of significant diagnostics from this hospitalization (including imaging, microbiology, ancillary and laboratory) are listed below for reference.   Imaging Studies: ECHOCARDIOGRAM COMPLETE Result Date: 04/03/2024    ECHOCARDIOGRAM REPORT   Patient Name:   Carolyn Clark Date of Exam: 04/03/2024 Medical Rec #:  [id number]    Height:       64.0 in Accession #:    [id number]   Weight:       181.9 lb Date of Birth:  Apr 12, 1950    BSA:          1.879 m Patient Age:    74 years     BP:           110/75 mmHg Patient Gender: F            HR:           89 bpm. Exam Location:  [facility] Procedure: 2D Echo, Cardiac Doppler, Color Doppler and Intracardiac            Opacification Agent (Both Spectral and Color Flow Doppler were            utilized during procedure). Indications:     Acute MI  History:         Patient has no prior history of Echocardiogram examinations.                  Acute MI; Signs/Symptoms:Hypertensive Heart Disease.  Sonographer:     Rainelle Gull Referring Phys:  [phone] CHRISTOPHER END Diagnosing Phys: Evalene Lunger MD  Sonographer Comments: Suboptimal apical window. IMPRESSIONS  1. Left ventricular ejection fraction,  by estimation, is 40 to 45%. The left ventricle has mildly decreased function. The left ventricle demonstrates regional wall motion abnormalities (severe hypokinesis of the mid to distal anterior, anteroseptal and apical wall). There is mild asymmetric  left ventricular hypertrophy of the basal-septal segment. Left ventricular diastolic parameters are consistent with Grade I diastolic dysfunction (impaired relaxation).  2. Right ventricular systolic function is normal. The right ventricular size is normal.  3. The mitral valve is normal in structure. No evidence of mitral valve regurgitation. No evidence of mitral stenosis.  4. The aortic valve is normal in structure. Aortic valve regurgitation is not visualized. No aortic stenosis is present.  5. The inferior vena cava is normal in size with greater than 50% respiratory variability, suggesting right atrial pressure of 3 mmHg. FINDINGS  Left Ventricle: Left ventricular ejection fraction, by estimation, is 40 to 45%. The left ventricle has mildly decreased function. The left ventricle demonstrates regional wall motion abnormalities. Strain was performed and the global longitudinal strain is indeterminate. The left ventricular internal cavity size was normal in size. There is moderate asymmetric left ventricular hypertrophy of the basal-septal segment. Left ventricular diastolic parameters are consistent with Grade I diastolic dysfunction (impaired relaxation). Right Ventricle: The right ventricular size is normal. No increase in right ventricular wall thickness. Right ventricular systolic function is normal. Left Atrium: Left atrial size was normal in size. Right Atrium: Right atrial size was normal in size. Pericardium: There is no evidence of pericardial effusion. Mitral Valve: The mitral valve is normal in structure. No evidence of mitral valve regurgitation. No evidence of mitral valve stenosis. Tricuspid Valve: The tricuspid valve is normal in structure.  Tricuspid valve regurgitation is not demonstrated. No evidence of tricuspid stenosis. Aortic Valve: The aortic valve is normal in structure. Aortic valve regurgitation is not visualized. No aortic stenosis is present. Aortic valve mean gradient measures 3.0 mmHg. Aortic valve peak gradient measures 5.5 mmHg. Aortic valve area, by VTI measures 2.28 cm. Pulmonic Valve: The pulmonic valve was normal in structure. Pulmonic valve regurgitation is not visualized. No evidence of pulmonic stenosis. Aorta: The aortic root is normal in size and structure. Venous: The inferior vena cava is normal in size with greater than 50% respiratory variability, suggesting right atrial pressure of 3 mmHg. IAS/Shunts: No atrial level shunt detected by color flow Doppler. Additional Comments: 3D was performed not requiring image post processing on an independent workstation and was indeterminate.  LEFT VENTRICLE PLAX 2D LVIDd:         3.20 cm     Diastology LVIDs:         2.10 cm     LV e' medial:    4.03 cm/s LV PW:         1.00 cm     LV E/e' medial:  12.9 LV IVS:        1.00 cm     LV e' lateral:   5.98 cm/s LVOT diam:     1.90 cm     LV E/e' lateral: 8.7 LV SV:         41 LV SV Index:   22 LVOT Area:     2.84 cm  LV Volumes (MOD) LV vol d, MOD A2C: 56.4 ml LV vol d, MOD A4C: 51.4 ml LV vol s, MOD A2C: 26.0 ml LV vol s, MOD A4C: 24.5 ml LV SV MOD A2C:     30.4 ml LV SV MOD A4C:     51.4 ml LV SV MOD BP:      28.7 ml RIGHT VENTRICLE RV Basal diam:  2.80 cm RV Mid diam:    1.80 cm RV S prime:     11.20 cm/s TAPSE (M-mode): 2.0 cm  LEFT ATRIUM             Index        RIGHT ATRIUM          Index LA Vol (A2C):   28.7 ml 15.27 ml/m  RA Area:     9.82 cm LA Vol (A4C):   29.8 ml 15.86 ml/m  RA Volume:   16.50 ml 8.78 ml/m LA Biplane Vol: 30.0 ml 15.97 ml/m  AORTIC VALVE                    PULMONIC VALVE AV Area (Vmax):    2.02 cm     PV Vmax:          1.28 m/s AV Area (Vmean):   1.96 cm     PV Vmean:         88.800 cm/s AV Area (VTI):      2.28 cm     PV VTI:           0.218 m AV Vmax:           117.00 cm/s  PV Peak grad:     6.6 mmHg AV Vmean:          84.700 cm/s  PV Mean grad:     4.0 mmHg AV VTI:            0.180 m      PR End Diast Vel: 3.15 msec AV Peak Grad:      5.5 mmHg AV Mean Grad:      3.0 mmHg LVOT Vmax:         83.30 cm/s LVOT Vmean:        58.600 cm/s LVOT VTI:          0.145 m LVOT/AV VTI ratio: 0.81  AORTA Ao Root diam: 2.50 cm Ao Asc diam:  2.50 cm MITRAL VALVE MV Area (PHT): 5.42 cm    SHUNTS MV Decel Time: 140 msec    Systemic VTI:  0.14 m MV E velocity: 52.10 cm/s  Systemic Diam: 1.90 cm MV A velocity: 87.30 cm/s MV E/A ratio:  0.60 Evalene Lunger MD Electronically signed by Evalene Lunger MD Signature Date/Time: 04/03/2024/12:01:01 PM    Final    DG Chest 1 View Result Date: 04/02/2024 EXAM: 1 VIEW XRAY OF THE CHEST 04/02/2024 03:41:00 PM COMPARISON: 06/25/2019 CLINICAL HISTORY: Congestive heart failure. ICD10: [id number] Congestive heart failure. FINDINGS: LUNGS AND PLEURA: No focal pulmonary opacity. No pleural effusion. No pneumothorax. HEART AND MEDIASTINUM: No acute abnormality of the cardiac and mediastinal silhouettes. BONES AND SOFT TISSUES: No acute osseous abnormality. IMPRESSION: 1. No acute cardiopulmonary abnormality. Electronically signed by: Oneil Devonshire MD 04/02/2024 10:34 PM EST RP Workstation: [id number]   CARDIAC CATHETERIZATION Result Date: 04/02/2024   Mid LAD lesion is 100% stenosed.   Mid LAD to Dist LAD lesion is 85% stenosed.   A drug-eluting stent was successfully placed using a STENT ONYX FRONTIER 2.75X15.   A drug-eluting stent was successfully placed using a STENT ONYX FRONTIER 2.25X15.   Post intervention, there is a 0% residual stenosis.   Post intervention, there is a 0% residual stenosis.   There is moderate to severe left ventricular systolic dysfunction.   LV end diastolic pressure is moderately elevated.   There is no aortic valve stenosis.   In the absence of any other complications or  medical issues, we expect the patient to be ready for discharge from an interventional cardiology perspective on 04/04/2024.  Recommend uninterrupted dual antiplatelet therapy with Aspirin  81mg  daily and Prasugrel  10mg  daily for a minimum of 12 months (ACS-Class I recommendation). Conclusions: Severe single-vessel coronary artery disease with thrombotic occlusion of mid LAD followed by 80-90% mid/distal LAD stenosis.  Mild, nonobstructive coronary artery disease noted in LCx and RCA. Moderately elevated left ventricular filling pressure (LVEDP 25 mmHg). Moderately-severely reduced left ventricular systolic function (LVEF 30-35% with mid/apical anterior and inferior hypokinesis/akinesis). Successful PCI to mid and mid/distal LAD with non-overlapping Onyx Frontier 2.75 x 15 mm (proximal) and 2.25 x 15 mm (distal) drug-eluting stents with 0% residual stenosis and TIMI-3 flow.  Recommendations: Admit to ICU for post-STEMI care. Dual antiplatelet therapy with aspirin  and prasugrel  for at least 12 months. Aggressive secondary prevention of coronary artery disease. Obtain echocardiogram. Gentle diuresis and initiation of goal-directed medical therapy for acute HFrEF due to ischemic cardiomyopathy, as tolerated. Lonni Hanson, MD [facility]   Microbiology: Results for orders placed or performed during the hospital encounter of 04/02/24  MRSA Next Gen by PCR, Nasal     Status: None   Collection Time: 04/02/24  2:32 PM   Specimen: Nasal Mucosa; Nasal Swab  Result Value Ref Range Status   MRSA by PCR Next Gen NOT DETECTED NOT DETECTED Final    Comment: (NOTE) The GeneXpert MRSA Assay (FDA approved for NASAL specimens only), is one component of a comprehensive MRSA colonization surveillance program. It is not intended to diagnose MRSA infection nor to guide or monitor treatment for MRSA infections. Test performance is not FDA approved in patients less than 64 years old. Performed at [facility],  [address] [place], [place] [address]     Labs: CBC: Recent Labs  Lab 04/02/24 1313 04/03/24 0416  WBC 5.4 7.5  NEUTROABS 2.1  --   HGB 13.2 12.8  HCT 41.3 38.9  MCV 91.0 89.2  PLT 249 248   Basic Metabolic Panel: Recent Labs  Lab 04/02/24 1313 04/02/24 1339 04/03/24 0416  NA 141  --  136  K 3.9  --  3.8  CL 104  --  99  CO2 24  --  23  GLUCOSE 188*  --  183*  BUN 20  --  22  CREATININE 1.12* 1.00 1.06*  CALCIUM  10.4*  --  9.5   Liver Function Tests: Recent Labs  Lab 04/02/24 1313  AST 24  ALT 17  ALKPHOS 83  BILITOT 0.4  PROT 8.2*  ALBUMIN 4.5   CBG: Recent Labs  Lab 04/03/24 0803 04/03/24 1154 04/03/24 1655 04/03/24 2115 04/04/24 0744  GLUCAP 162* 165* 111* 148* 126*    Discharge time spent: greater than 30 minutes.  Signed: AIDA CHO, MD [facility] 04/04/2024 "

## 2024-04-04 NOTE — Discharge Instructions (Signed)
 Follow up with PCP and pick up medications from pharmacy if required. Make sure to take all antibiotics if prescribed. Do not double up on narcotic/pain medication or drink alcohol during this time. Do not drive while taking narcotic medication. Call 911 or return to ER for life threatening issues or other concerns.

## 2024-04-04 NOTE — Progress Notes (Signed)
 "  Rounding Note   Patient Name: Carolyn Clark Date of Encounter: 04/04/2024  [facility] Cardiologist:  Dr. Lonni End, [facility]   Subjective Resting well in bed, anxious to go home Denies significant shortness of breath or chest pain Daughter at the bedside Telemetry reviewed, normal sinus rhythm, no significant arrhythmia Discussed echocardiogram detailing EF 40%, anterior wall hypokinesis   Scheduled Meds:   aspirin   81 mg Oral Daily    atorvastatin   80 mg Oral Daily   carvedilol   3.125 mg Oral BID WC   Chlorhexidine  Gluconate Cloth  6 each Topical Q0600   enoxaparin  (LOVENOX ) injection  40 mg Subcutaneous Q24H   ezetimibe   10 mg Oral Daily   insulin  aspart  0-15 Units Subcutaneous TID WC   insulin  aspart  0-5 Units Subcutaneous QHS   losartan   12.5 mg Oral Daily   prasugrel   10 mg Oral Daily   sodium chloride  flush  3 mL Intravenous Q12H    Vital Signs  There were no vitals filed for this visit. No intake or output data in the 24 hours ending 04/04/24 1212    04/02/2024    3:01 PM 04/02/2024    1:43 PM 04/02/2024    1:22 PM  Last 3 Weights  Weight (lbs) 181 lb 14.1 oz 173 lb 15.1 oz 173 lb 15.1 oz  Weight (kg) 82.5 kg 78.9 kg 78.9 kg      Telemetry Normal sinus rhythm- Personally Reviewed  ECG   - Personally Reviewed  Physical Exam  GEN: No acute distress.   Neck: No JVD Cardiac: RRR, no murmurs, rubs, or gallops.  Respiratory: Clear to auscultation bilaterally. GI: Soft, nontender, non-distended  MS: No edema; No deformity. Neuro:  Nonfocal  Psych: Normal affect   Labs High Sensitivity Troponin:  No results for input(s): TROPONINIHS in the last 720 hours.  Recent Labs  Lab 04/02/24 1313 04/02/24 1554  TRNPT 13 892*       Chemistry Recent Labs  Lab 04/02/24 1313 04/02/24 1339 04/03/24 0416  NA 141  --  136  K 3.9  --  3.8  CL 104  --  99  CO2 24  --  23  GLUCOSE 188*  --  183*  BUN 20  --  22  CREATININE 1.12*  1.00 1.06*  CALCIUM  10.4*  --  9.5  PROT 8.2*  --   --   ALBUMIN 4.5  --   --   AST 24  --   --   ALT 17  --   --   ALKPHOS 83  --   --   BILITOT 0.4  --   --   GFRNONAA 52*  --  55*  ANIONGAP 13  --  14    Lipids  Recent Labs  Lab 04/02/24 1330  CHOL 188  TRIG 159*  HDL 46  LDLCALC 111*  CHOLHDL 4.1    Hematology Recent Labs  Lab 04/02/24 1313 04/03/24 0416  WBC 5.4 7.5  RBC 4.54 4.36  HGB 13.2 12.8  HCT 41.3 38.9  MCV 91.0 89.2  MCH 29.1 29.4  MCHC 32.0 32.9  RDW 13.5 13.5  PLT 249 248   Thyroid No results for input(s): TSH, FREET4 in the last 168 hours.  BNPNo results for input(s): BNP, PROBNP in the last 168 hours.  DDimer No results for input(s): DDIMER in the last 168 hours.   Radiology  ECHOCARDIOGRAM COMPLETE Result Date: 04/03/2024    ECHOCARDIOGRAM REPORT  Patient Name:   Carolyn Clark Dowling Date of Exam: 04/03/2024 Medical Rec #:  [id number]    Height:       64.0 in Accession #:    [id number]   Weight:       181.9 lb Date of Birth:  22-Sep-1950    BSA:          1.879 m Patient Age:    74 years     BP:           110/75 mmHg Patient Gender: F            HR:           89 bpm. Exam Location:  [facility] Procedure: 2D Echo, Cardiac Doppler, Color Doppler and Intracardiac            Opacification Agent (Both Spectral and Color Flow Doppler were            utilized during procedure). Indications:     Acute MI  History:         Patient has no prior history of Echocardiogram examinations.                  Acute MI; Signs/Symptoms:Hypertensive Heart Disease.  Sonographer:     Rainelle Gull Referring Phys:  [id number] CHRISTOPHER END Diagnosing Phys: Evalene Lunger MD  Sonographer Comments: Suboptimal apical window. IMPRESSIONS  1. Left ventricular ejection fraction, by estimation, is 40 to 45%. The left ventricle has mildly decreased function. The left ventricle demonstrates regional wall motion abnormalities (severe hypokinesis of the mid to distal anterior, anteroseptal and apical  wall). There is mild asymmetric left ventricular hypertrophy of the basal-septal segment. Left ventricular diastolic parameters are consistent with Grade I diastolic dysfunction (impaired relaxation).  2. Right ventricular systolic function is normal. The right ventricular size is normal.  3. The mitral valve is normal in structure. No evidence of mitral valve regurgitation. No evidence of mitral stenosis.  4. The aortic valve is normal in structure. Aortic valve regurgitation is not visualized. No aortic stenosis is present.  5. The inferior vena cava is normal in size with greater than 50% respiratory variability, suggesting right atrial pressure of 3 mmHg. FINDINGS  Left Ventricle: Left ventricular ejection fraction, by estimation, is 40 to 45%. The left ventricle has mildly decreased function. The left ventricle demonstrates regional wall motion abnormalities. Strain was performed and the global longitudinal strain is indeterminate. The left ventricular internal cavity size was normal in size. There is moderate asymmetric left ventricular hypertrophy of the basal-septal segment. Left ventricular diastolic parameters are consistent with Grade I diastolic dysfunction (impaired relaxation). Right Ventricle: The right ventricular size is normal. No increase in right ventricular wall thickness. Right ventricular systolic function is normal. Left Atrium: Left atrial size was normal in size. Right Atrium: Right atrial size was normal in size. Pericardium: There is no evidence of pericardial effusion. Mitral Valve: The mitral valve is normal in structure. No evidence of mitral valve regurgitation. No evidence of mitral valve stenosis. Tricuspid Valve: The tricuspid valve is normal in structure. Tricuspid valve regurgitation is not demonstrated. No evidence of tricuspid stenosis. Aortic Valve: The aortic valve is normal in structure. Aortic valve regurgitation is not visualized. No aortic stenosis is present. Aortic  valve mean gradient measures 3.0 mmHg. Aortic valve peak gradient measures 5.5 mmHg. Aortic valve area, by VTI measures 2.28 cm. Pulmonic Valve: The pulmonic valve was normal in structure. Pulmonic valve regurgitation is not visualized. No evidence of pulmonic  stenosis. Aorta: The aortic root is normal in size and structure. Venous: The inferior vena cava is normal in size with greater than 50% respiratory variability, suggesting right atrial pressure of 3 mmHg. IAS/Shunts: No atrial level shunt detected by color flow Doppler. Additional Comments: 3D was performed not requiring image post processing on an independent workstation and was indeterminate.  LEFT VENTRICLE PLAX 2D LVIDd:         3.20 cm     Diastology LVIDs:         2.10 cm     LV e' medial:    4.03 cm/s LV PW:         1.00 cm     LV E/e' medial:  12.9 LV IVS:        1.00 cm     LV e' lateral:   5.98 cm/s LVOT diam:     1.90 cm     LV E/e' lateral: 8.7 LV SV:         41 LV SV Index:   22 LVOT Area:     2.84 cm  LV Volumes (MOD) LV vol d, MOD A2C: 56.4 ml LV vol d, MOD A4C: 51.4 ml LV vol s, MOD A2C: 26.0 ml LV vol s, MOD A4C: 24.5 ml LV SV MOD A2C:     30.4 ml LV SV MOD A4C:     51.4 ml LV SV MOD BP:      28.7 ml RIGHT VENTRICLE RV Basal diam:  2.80 cm RV Mid diam:    1.80 cm RV S prime:     11.20 cm/s TAPSE (M-mode): 2.0 cm LEFT ATRIUM             Index        RIGHT ATRIUM          Index LA Vol (A2C):   28.7 ml 15.27 ml/m  RA Area:     9.82 cm LA Vol (A4C):   29.8 ml 15.86 ml/m  RA Volume:   16.50 ml 8.78 ml/m LA Biplane Vol: 30.0 ml 15.97 ml/m  AORTIC VALVE                    PULMONIC VALVE AV Area (Vmax):    2.02 cm     PV Vmax:          1.28 m/s AV Area (Vmean):   1.96 cm     PV Vmean:         88.800 cm/s AV Area (VTI):     2.28 cm     PV VTI:           0.218 m AV Vmax:           117.00 cm/s  PV Peak grad:     6.6 mmHg AV Vmean:          84.700 cm/s  PV Mean grad:     4.0 mmHg AV VTI:            0.180 m      PR End Diast Vel: 3.15 msec AV  Peak Grad:      5.5 mmHg AV Mean Grad:      3.0 mmHg LVOT Vmax:         83.30 cm/s LVOT Vmean:        58.600 cm/s LVOT VTI:          0.145 m LVOT/AV VTI ratio: 0.81  AORTA Ao Root diam: 2.50 cm Ao Asc diam:  2.50 cm MITRAL VALVE MV Area (PHT): 5.42 cm    SHUNTS MV Decel Time: 140 msec    Systemic VTI:  0.14 m MV E velocity: 52.10 cm/s  Systemic Diam: 1.90 cm MV A velocity: 87.30 cm/s MV E/A ratio:  0.60 Evalene Lunger MD Electronically signed by Evalene Lunger MD Signature Date/Time: 04/03/2024/12:01:01 PM    Final    DG Chest 1 View Result Date: 04/02/2024 EXAM: 1 VIEW XRAY OF THE CHEST 04/02/2024 03:41:00 PM COMPARISON: 06/25/2019 CLINICAL HISTORY: Congestive heart failure. ICD10: [id number] Congestive heart failure. FINDINGS: LUNGS AND PLEURA: No focal pulmonary opacity. No pleural effusion. No pneumothorax. HEART AND MEDIASTINUM: No acute abnormality of the cardiac and mediastinal silhouettes. BONES AND SOFT TISSUES: No acute osseous abnormality. IMPRESSION: 1. No acute cardiopulmonary abnormality. Electronically signed by: Oneil Devonshire MD 04/02/2024 10:34 PM EST RP Workstation: [id number]   CARDIAC CATHETERIZATION Result Date: 04/02/2024   Mid LAD lesion is 100% stenosed.   Mid LAD to Dist LAD lesion is 85% stenosed.   A drug-eluting stent was successfully placed using a STENT ONYX FRONTIER 2.75X15.   A drug-eluting stent was successfully placed using a STENT ONYX FRONTIER 2.25X15.   Post intervention, there is a 0% residual stenosis.   Post intervention, there is a 0% residual stenosis.   There is moderate to severe left ventricular systolic dysfunction.   LV end diastolic pressure is moderately elevated.   There is no aortic valve stenosis.   In the absence of any other complications or medical issues, we expect the patient to be ready for discharge from an interventional cardiology perspective on 04/04/2024.   Recommend uninterrupted dual antiplatelet therapy with Aspirin  81mg  daily and Prasugrel  10mg  daily  for a minimum of 12 months (ACS-Class I recommendation). Conclusions: Severe single-vessel coronary artery disease with thrombotic occlusion of mid LAD followed by 80-90% mid/distal LAD stenosis.  Mild, nonobstructive coronary artery disease noted in LCx and RCA. Moderately elevated left ventricular filling pressure (LVEDP 25 mmHg). Moderately-severely reduced left ventricular systolic function (LVEF 30-35% with mid/apical anterior and inferior hypokinesis/akinesis). Successful PCI to mid and mid/distal LAD with non-overlapping Onyx Frontier 2.75 x 15 mm (proximal) and 2.25 x 15 mm (distal) drug-eluting stents with 0% residual stenosis and TIMI-3 flow.  Recommendations: Admit to ICU for post-STEMI care. Dual antiplatelet therapy with aspirin  and prasugrel  for at least 12 months. Aggressive secondary prevention of coronary artery disease. Obtain echocardiogram. Gentle diuresis and initiation of goal-directed medical therapy for acute HFrEF due to ischemic cardiomyopathy, as tolerated. Lonni Hanson, MD [facility]   Cardiac Studies Echo as above  Patient Profile   Carolyn Clark is a 74 y.o. female with a hx of hypertension, hyperlipidemia, and type 2 diabetes mellitus, who is being seen 04/02/2024 for the evaluation of angina, STEMI, stent x 2 to the LAD   Assessment & Plan  STEMI Severe single-vessel coronary artery disease with thrombotic occlusion of mid LAD followed by 80-90% mid/distal LAD stenosis.  Mild, nonobstructive coronary artery disease noted in LCx and RCA. -- Stent x 2 placed to the LAD -non-overlapping Onyx Frontier 2.75 x 15 mm (proximal) and 2.25 x 15 mm (distal) drug-eluting stents with 0% residual stenosis and TIMI-3 flow.  -Doing well this morning with no significant arrhythmia Ejection fraction 40% on echo with anterior wall hypokinesis -Recommend she continue aspirin , Effient  - Lipitor  80  Zetia  10 mg daily losartan  12.5 daily and Coreg  3.125 twice daily - Recommend cardiac  rehab - Discussed restrictions post catheterization, avoiding  heavy lifting using right arm 1 to 2 weeks Case discussed with pharmacy, they will help arrange medications to the bedside  Signed, Velinda Lunger, MD, Ph.D [facility]  "

## 2024-04-05 ENCOUNTER — Ambulatory Visit: Payer: Self-pay | Admitting: Internal Medicine

## 2024-04-05 LAB — LIPOPROTEIN A (LPA): Lipoprotein (a): 258.1 nmol/L — ABNORMAL HIGH

## 2024-04-05 NOTE — Telephone Encounter (Signed)
 Pt returning call

## 2024-04-10 ENCOUNTER — Ambulatory Visit: Admitting: Cardiology

## 2024-04-10 ENCOUNTER — Encounter: Payer: Self-pay | Admitting: Cardiology

## 2024-04-10 VITALS — BP 144/70 | HR 86 | Ht 64.0 in | Wt 173.6 lb

## 2024-04-10 DIAGNOSIS — I25119 Atherosclerotic heart disease of native coronary artery with unspecified angina pectoris: Secondary | ICD-10-CM

## 2024-04-10 DIAGNOSIS — E782 Mixed hyperlipidemia: Secondary | ICD-10-CM

## 2024-04-10 DIAGNOSIS — I255 Ischemic cardiomyopathy: Secondary | ICD-10-CM

## 2024-04-10 DIAGNOSIS — I2102 ST elevation (STEMI) myocardial infarction involving left anterior descending coronary artery: Secondary | ICD-10-CM

## 2024-04-10 DIAGNOSIS — I5020 Unspecified systolic (congestive) heart failure: Secondary | ICD-10-CM

## 2024-04-10 DIAGNOSIS — E7841 Elevated Lipoprotein(a): Secondary | ICD-10-CM

## 2024-04-10 NOTE — Patient Instructions (Signed)
 Medication Instructions:  Your physician recommends that you continue on your current medications as directed. Please refer to the Current Medication list given to you today.   *If you need a refill on your cardiac medications before your next appointment, please call your pharmacy*  Lab Work: Your provider would like for you to have following labs drawn today BMP, CBC.  Your provider would like for you to return in 6 weeks to have the following labs drawn: fasting lipid, ALT, AST.   Please go to [facility] [address] ([facility]) [address], [place] [address] You do not need an appointment.  They are open from 8 am- 4:30 pm.  Lunch from 1:00 pm- 2:00 pm You DO need to be fasting.  You may also go to one of the following [facility]:  [address] [place], [place] [address] Phone: [phone] Lab hours: Mon-Fri 8 am- 5 pm    Lunch 12 pm- 1 pm  [address] [place],  [place]  [address]  [place]  Phone: [phone] Lab hours: 7 am- 4 pm Lunch 12 pm-1 pm   [address] [place],  [place]  [address]  [place]  Phone: [phone] Lab hours: Mon-Fri 8 am- 5 pm    Lunch 12 pm- 1 pm    If you have labs (blood work) drawn today and your tests are completely normal, you will receive your results only by: MyChart Message (if you have MyChart) OR A paper copy in the mail If you have any lab test that is abnormal or we need to change your treatment, we will call you to review the results.  Testing/Procedures: Your physician has requested that you have an echocardiogram in 3 months. Echocardiography is a painless test that uses sound waves to create images of your heart. It provides your doctor with information about the size and shape of your heart and how well your hearts chambers and valves are working.   You may receive an ultrasound enhancing agent through an IV if needed to better visualize your heart during the echo. This procedure takes approximately one hour.  There  are no restrictions for this procedure.  This will take place at [address] [facility]) [address], [place] [address]  Please note: We ask at that you not bring children with you during ultrasound (echo/ vascular) testing. Due to room size and safety concerns, children are not allowed in the ultrasound rooms during exams. Our front office staff cannot provide observation of children in our lobby area while testing is being conducted. An adult accompanying a patient to their appointment will only be allowed in the ultrasound room at the discretion of the ultrasound technician under special circumstances. We apologize for any inconvenience.   Follow-Up: At [facility], you and your health needs are our priority.  As part of our continuing mission to provide you with exceptional heart care, our providers are all part of one team.  This team includes your primary Cardiologist (physician) and Advanced Practice Providers or APPs (Physician Assistants and Nurse Practitioners) who all work together to provide you with the care you need, when you need it.  Your next appointment:   6 week(s)  Provider:   Tylene Lunch, NP    We recommend signing up for the patient portal called MyChart.  Sign up information is provided on this After Visit Summary.  MyChart is used to connect with patients for Virtual Visits (Telemedicine).  Patients are able to view lab/test results,  encounter notes, upcoming appointments, etc.  Non-urgent messages can be sent to your provider as well.   To learn more about what you can do with MyChart, go to [email or web address].

## 2024-04-10 NOTE — Progress Notes (Unsigned)
 " Cardiology Office Note   Date:  04/12/2024  ID:  Carolyn Clark, DOB Dec 01, 1950, MRN [id number] PCP: Tiffany Lamar Rush, MD  [facility] Providers Cardiologist:  Lonni Hanson, MD Cardiology APP:  Gerard Frederick, NP     History of Present Illness Carolyn Clark is a 74 y.o. female with past medical history of hypertension, hyperlipidemia, type 2 diabetes, coronary artery disease with recent STEMI (04/02/2024) status post PCI/DES to the LAD, who is here today for hospital follow-up.   She was previously hospitalized at [facility] 1/27 - 04/04/2024 with ST elevated myocardial infarction with successful PCI/DES with 2 stents placed to the LAD.  She presented to the hospital on 04/02/2024 with complaints of severe chest pain.  She was emergently taken to the cardiac Cath Lab for left heart catheterization.  She was found to have severe single-vessel CAD with thrombotic occlusion of the mid LAD followed by 80-90% mid/distal LAD stenosis, nonobstructive CAD in the left circumflex and RCA, moderately elevated LVEDP (25 mmHg), with an LVEF of 30-35%.  Successful PCI to the mid and mid distal LAD with stents placed.  She was admitted to stepdown unit for further management.  Echocardiogram revealed an LVEF of 40-45% which was mildly decreased function.  The left ventricle demonstrated regional wall motion LML disease with severe hypokinesis of the mid to distal anterior, anterior septal, and apical wall.  There was mild asymmetric left ventricular hypertrophy of the basal septal segment.  G1 DD, there were no valvular abnormalities that were noted.  She was discharged on DAPT of aspirin  and prasugrel .  She was also started on atorvastatin  80 mg daily, carvedilol  3.125 mg twice daily, ezetimibe  10 mg daily, and losartan  12.5 mg daily.  She was considered stable and remained chest pain-free and was discharged on 04/04/2024.   She returns to clinic today accompanied by family member.  She states overall for the  cardiac perspective she has been doing fairly well.  She denies any recurrent chest pain, shortness of breath or dyspnea on exertion.  She states that she really experiences a little DOE from walking extended distances.  States that she has been tolerating all of the medications without any undue side effects.  She has several questions today related to what her activity can be.  ROS: 10 point review of systems has been reviewed and considered negative the exception was been listed in the HPI  Studies Reviewed EKG Interpretation Date/Time:  Wednesday April 10 2024 14:18:53 EST Ventricular Rate:  84 PR Interval:  168 QRS Duration:  74 QT Interval:  392 QTC Calculation: 463 R Axis:   20  Text Interpretation: Normal sinus rhythm Cannot rule out Anteroseptal infarct , age undetermined T wave abnormality, consider lateral ischemia When compared with ECG of 02-Apr-2024 19:36, Confirmed by Gerard Frederick ([id number]) on 04/12/2024 10:10:49 AM    Cardiac Studies & Procedures   ______________________________________________________________________________________________ CARDIAC CATHETERIZATION  CARDIAC CATHETERIZATION 04/02/2024  Conclusion   Mid LAD lesion is 100% stenosed.   Mid LAD to Dist LAD lesion is 85% stenosed.   A drug-eluting stent was successfully placed using a STENT ONYX FRONTIER 2.75X15.   A drug-eluting stent was successfully placed using a STENT ONYX FRONTIER 2.25X15.   Post intervention, there is a 0% residual stenosis.   Post intervention, there is a 0% residual stenosis.   There is moderate to severe left ventricular systolic dysfunction.   LV end diastolic pressure is moderately elevated.   There is no aortic valve  stenosis.   In the absence of any other complications or medical issues, we expect the patient to be ready for discharge from an interventional cardiology perspective on 04/04/2024.   Recommend uninterrupted dual antiplatelet therapy with Aspirin  81mg  daily and  Prasugrel  10mg  daily for a minimum of 12 months (ACS-Class I recommendation).  Conclusions: Severe single-vessel coronary artery disease with thrombotic occlusion of mid LAD followed by 80-90% mid/distal LAD stenosis.  Mild, nonobstructive coronary artery disease noted in LCx and RCA. Moderately elevated left ventricular filling pressure (LVEDP 25 mmHg). Moderately-severely reduced left ventricular systolic function (LVEF 30-35% with mid/apical anterior and inferior hypokinesis/akinesis). Successful PCI to mid and mid/distal LAD with non-overlapping Onyx Frontier 2.75 x 15 mm (proximal) and 2.25 x 15 mm (distal) drug-eluting stents with 0% residual stenosis and TIMI-3 flow.  Recommendations: Admit to ICU for post-STEMI care. Dual antiplatelet therapy with aspirin  and prasugrel  for at least 12 months. Aggressive secondary prevention of coronary artery disease. Obtain echocardiogram. Gentle diuresis and initiation of goal-directed medical therapy for acute HFrEF due to ischemic cardiomyopathy, as tolerated.  Lonni Hanson, MD [facility]  Findings Coronary Findings Diagnostic  Dominance: Right  Left Main Vessel is large. Vessel is angiographically normal.  Left Anterior Descending Vessel is large. Collaterals Dist LAD filled by collaterals from RPDA.  Collaterals Dist LAD filled by collaterals from 1st RPL.  Mid LAD lesion is 100% stenosed. Vessel is the culprit lesion. The lesion is thrombotic with right-to-left collateral flow. Mid LAD to Dist LAD lesion is 85% stenosed.  First Diagonal Branch Vessel is moderate in size. There is mild disease in the vessel.  Second Diagonal Branch Vessel is small in size.  Third Diagonal Branch Vessel is small in size.  Left Circumflex Vessel is moderate in size. There is mild diffuse disease throughout the vessel.  First Obtuse Marginal Branch Vessel is small in size.  Second Obtuse Marginal Branch Vessel is moderate in  size.  Right Coronary Artery Vessel is moderate in size.  Right Posterior Descending Artery Vessel is small in size.  First Right Posterolateral Branch Vessel is small in size.  Intervention  Mid LAD lesion Stent Lesion length:  12 mm. CATH LAUNCHER 6FR EBU3.5 guide catheter was inserted. Lesion crossed with guidewire using a WIRE RUNTHROUGH .[id number]. Pre-stent angioplasty was performed using a BALLOON EUPHORA RX 2.5X12. Maximum pressure:  8 atm.  A second angioplasty balloon was used, using a BALLOON MINITREK RX 2.0X12. Maximum pressure:  8 atm. A drug-eluting stent was successfully placed using a STENT ONYX FRONTIER 2.75X15. Maximum pressure: 16 atm. Stent strut is well apposed. Post-stent angioplasty was performed using a BALLOON [place] TREK NEO RX 3.25X12. Maximum pressure:  16 atm. Post-Intervention Lesion Assessment The intervention was successful. Pre-interventional TIMI flow is 0. Post-intervention TIMI flow is 3. No complications occurred at this lesion. There is a 0% residual stenosis post intervention.  Mid LAD to Dist LAD lesion Stent Lesion length:  12 mm. CATH LAUNCHER 6FR EBU3.5 guide catheter was inserted. Lesion crossed with guidewire using a WIRE RUNTHROUGH .[id number]. Pre-stent angioplasty was performed using a BALLOON MINITREK RX 2.0X12. Maximum pressure:  8 atm. A drug-eluting stent was successfully placed using a STENT ONYX FRONTIER 2.25X15. Maximum pressure: 12 atm. Stent strut is well apposed. Post-stent angioplasty was performed using a BALLOON [place] TREK NEO RX 2.5X12. Maximum pressure:  16 atm. Post-Intervention Lesion Assessment The intervention was successful. Pre-interventional TIMI flow is 0. Post-intervention TIMI flow is 3. No complications occurred at this lesion. There  is a 0% residual stenosis post intervention.     ECHOCARDIOGRAM  ECHOCARDIOGRAM COMPLETE 04/03/2024  Narrative ECHOCARDIOGRAM REPORT    Patient Name:   Carolyn Clark Date of Exam:  04/03/2024 Medical Rec #:  [id number]    Height:       64.0 in Accession #:    [id number]   Weight:       181.9 lb Date of Birth:  03/16/1950    BSA:          1.879 m Patient Age:    74 years     BP:           110/75 mmHg Patient Gender: F            HR:           89 bpm. Exam Location:  [facility]  Procedure: 2D Echo, Cardiac Doppler, Color Doppler and Intracardiac Opacification Agent (Both Spectral and Color Flow Doppler were utilized during procedure).  Indications:     Acute MI  History:         Patient has no prior history of Echocardiogram examinations. Acute MI; Signs/Symptoms:Hypertensive Heart Disease.  Sonographer:     Rainelle Gull Referring Phys:  [phone] CHRISTOPHER END Diagnosing Phys: Evalene Lunger MD   Sonographer Comments: Suboptimal apical window. IMPRESSIONS   1. Left ventricular ejection fraction, by estimation, is 40 to 45%. The left ventricle has mildly decreased function. The left ventricle demonstrates regional wall motion abnormalities (severe hypokinesis of the mid to distal anterior, anteroseptal and apical wall). There is mild asymmetric left ventricular hypertrophy of the basal-septal segment. Left ventricular diastolic parameters are consistent with Grade I diastolic dysfunction (impaired relaxation). 2. Right ventricular systolic function is normal. The right ventricular size is normal. 3. The mitral valve is normal in structure. No evidence of mitral valve regurgitation. No evidence of mitral stenosis. 4. The aortic valve is normal in structure. Aortic valve regurgitation is not visualized. No aortic stenosis is present. 5. The inferior vena cava is normal in size with greater than 50% respiratory variability, suggesting right atrial pressure of 3 mmHg.  FINDINGS Left Ventricle: Left ventricular ejection fraction, by estimation, is 40 to 45%. The left ventricle has mildly decreased function. The left ventricle demonstrates regional wall motion abnormalities.  Strain was performed and the global longitudinal strain is indeterminate. The left ventricular internal cavity size was normal in size. There is moderate asymmetric left ventricular hypertrophy of the basal-septal segment. Left ventricular diastolic parameters are consistent with Grade I diastolic dysfunction (impaired relaxation).  Right Ventricle: The right ventricular size is normal. No increase in right ventricular wall thickness. Right ventricular systolic function is normal.  Left Atrium: Left atrial size was normal in size.  Right Atrium: Right atrial size was normal in size.  Pericardium: There is no evidence of pericardial effusion.  Mitral Valve: The mitral valve is normal in structure. No evidence of mitral valve regurgitation. No evidence of mitral valve stenosis.  Tricuspid Valve: The tricuspid valve is normal in structure. Tricuspid valve regurgitation is not demonstrated. No evidence of tricuspid stenosis.  Aortic Valve: The aortic valve is normal in structure. Aortic valve regurgitation is not visualized. No aortic stenosis is present. Aortic valve mean gradient measures 3.0 mmHg. Aortic valve peak gradient measures 5.5 mmHg. Aortic valve area, by VTI measures 2.28 cm.  Pulmonic Valve: The pulmonic valve was normal in structure. Pulmonic valve regurgitation is not visualized. No evidence of pulmonic stenosis.  Aorta: The aortic root is normal  in size and structure.  Venous: The inferior vena cava is normal in size with greater than 50% respiratory variability, suggesting right atrial pressure of 3 mmHg.  IAS/Shunts: No atrial level shunt detected by color flow Doppler.  Additional Comments: 3D was performed not requiring image post processing on an independent workstation and was indeterminate.   LEFT VENTRICLE PLAX 2D LVIDd:         3.20 cm     Diastology LVIDs:         2.10 cm     LV e' medial:    4.03 cm/s LV PW:         1.00 cm     LV E/e' medial:  12.9 LV IVS:         1.00 cm     LV e' lateral:   5.98 cm/s LVOT diam:     1.90 cm     LV E/e' lateral: 8.7 LV SV:         41 LV SV Index:   22 LVOT Area:     2.84 cm  LV Volumes (MOD) LV vol d, MOD A2C: 56.4 ml LV vol d, MOD A4C: 51.4 ml LV vol s, MOD A2C: 26.0 ml LV vol s, MOD A4C: 24.5 ml LV SV MOD A2C:     30.4 ml LV SV MOD A4C:     51.4 ml LV SV MOD BP:      28.7 ml  RIGHT VENTRICLE RV Basal diam:  2.80 cm RV Mid diam:    1.80 cm RV S prime:     11.20 cm/s TAPSE (M-mode): 2.0 cm  LEFT ATRIUM             Index        RIGHT ATRIUM          Index LA Vol (A2C):   28.7 ml 15.27 ml/m  RA Area:     9.82 cm LA Vol (A4C):   29.8 ml 15.86 ml/m  RA Volume:   16.50 ml 8.78 ml/m LA Biplane Vol: 30.0 ml 15.97 ml/m AORTIC VALVE                    PULMONIC VALVE AV Area (Vmax):    2.02 cm     PV Vmax:          1.28 m/s AV Area (Vmean):   1.96 cm     PV Vmean:         88.800 cm/s AV Area (VTI):     2.28 cm     PV VTI:           0.218 m AV Vmax:           117.00 cm/s  PV Peak grad:     6.6 mmHg AV Vmean:          84.700 cm/s  PV Mean grad:     4.0 mmHg AV VTI:            0.180 m      PR End Diast Vel: 3.15 msec AV Peak Grad:      5.5 mmHg AV Mean Grad:      3.0 mmHg LVOT Vmax:         83.30 cm/s LVOT Vmean:        58.600 cm/s LVOT VTI:          0.145 m LVOT/AV VTI ratio: 0.81  AORTA Ao Root diam: 2.50 cm Ao Asc diam:  2.50  cm  MITRAL VALVE MV Area (PHT): 5.42 cm    SHUNTS MV Decel Time: 140 msec    Systemic VTI:  0.14 m MV E velocity: 52.10 cm/s  Systemic Diam: 1.90 cm MV A velocity: 87.30 cm/s MV E/A ratio:  0.60  Evalene Lunger MD Electronically signed by Evalene Lunger MD Signature Date/Time: 04/03/2024/12:01:01 PM    Final          ______________________________________________________________________________________________      Risk Assessment/Calculations     Physical Exam VS:  BP (!) 144/70 (BP Location: Left Arm, Patient Position: Sitting, Cuff Size:  Normal)   Pulse 86   Ht 5' 4 (1.626 m)   Wt 173 lb 9.6 oz (78.7 kg)   SpO2 98%   BMI 29.80 kg/m        Wt Readings from Last 3 Encounters:  04/10/24 173 lb 9.6 oz (78.7 kg)  04/02/24 181 lb 14.1 oz (82.5 kg)  06/25/19 160 lb (72.6 kg)    GEN: Well nourished, well developed in no acute distress NECK: No JVD; No carotid bruits CARDIAC: RRR, no murmurs, rubs, gallops RESPIRATORY:  Clear to auscultation without rales, wheezing or rhonchi  ABDOMEN: Soft, non-tender, non-distended EXTREMITIES:  No edema; No deformity   ASSESSMENT AND PLAN Coronary artery disease with recent anterior STEMI and stent placement to the mid and mid distal LAD 04/02/2024.  She returns today and she has been chest pain-free.  EKG reveals sinus rhythm with a rate of 84 anteroseptal infarct and T wave abnormalities that are improving.  She has continued on aspirin  81 mg daily and prasugrel  10 mg daily as she is also recommended to continue with DAPT for minimum of 12 months.  Aggressive secondary prevention including atorvastatin  80 mg daily.  She has been referred to cardiac rehab.  She is also being seen for an updated CBC and BMP today after recent hospitalization.  HFrEF/ischemic cardiomyopathy with an LVEF 40-45% status post STEMI with anterior wall hypokinesis.  She is continued on carvedilol  3.125 mg twice daily.  We are rechecking renal function today to determine if we are able to add any additional GDMT.  She is also continued on losartan  12.5 mg daily.  She is euvolemic on exam and not requiring standing diuretic therapy.  Will continue to escalate GDMT as tolerated by blood pressure and kidney function.  Hyperlipidemia associated with type 2 diabetes.  Most recent LDL of 111.  She does have an elevated LP(a) 258.1.  She has been continued on atorvastatin  80 mg daily and ezetimibe  10 mg daily for goal of an LDL of 55 or less.  She has been scheduled for an updated lipid panel as well as an updated AST and ALT.   She has also been referred to [facility].D. for potential of Repatha for elevated LP level A.    Cardiac Rehabilitation Eligibility Assessment         Dispo: Patient to return to see MD/APP in 6 weeks or sooner if needed for further evaluation.  Signed, Payton Moder, NP   "

## 2024-04-11 LAB — CBC
Hematocrit: 37.8 % (ref 34.0–46.6)
Hemoglobin: 12.5 g/dL (ref 11.1–15.9)
MCH: 29.8 pg (ref 26.6–33.0)
MCHC: 33.1 g/dL (ref 31.5–35.7)
MCV: 90 fL (ref 79–97)
Platelets: 263 10*3/uL (ref 150–450)
RBC: 4.19 x10E6/uL (ref 3.77–5.28)
RDW: 13.3 % (ref 11.7–15.4)
WBC: 7 10*3/uL (ref 3.4–10.8)

## 2024-04-11 LAB — BASIC METABOLIC PANEL WITH GFR
BUN/Creatinine Ratio: 17 (ref 12–28)
BUN: 19 mg/dL (ref 8–27)
CO2: 19 mmol/L — ABNORMAL LOW (ref 20–29)
Calcium: 9.9 mg/dL (ref 8.7–10.3)
Chloride: 108 mmol/L — ABNORMAL HIGH (ref 96–106)
Creatinine, Ser: 1.12 mg/dL — ABNORMAL HIGH (ref 0.57–1.00)
Glucose: 97 mg/dL (ref 70–99)
Potassium: 4.2 mmol/L (ref 3.5–5.2)
Sodium: 143 mmol/L (ref 134–144)
eGFR: 52 mL/min/{1.73_m2} — ABNORMAL LOW

## 2024-04-12 ENCOUNTER — Ambulatory Visit: Payer: Self-pay | Admitting: Cardiology

## 2024-04-12 DIAGNOSIS — Z79899 Other long term (current) drug therapy: Secondary | ICD-10-CM

## 2024-04-12 NOTE — Progress Notes (Signed)
 Recommend increasing hydration and avoiding NSAIDs.  Continue current medication regimen without changes at this time.  Recommend repeat BMP in 2 weeks to reevaluate kidney function as serum creatinine is slightly elevated from baseline.

## 2024-05-23 ENCOUNTER — Ambulatory Visit: Admitting: Pharmacist

## 2024-05-31 ENCOUNTER — Ambulatory Visit

## 2024-06-05 ENCOUNTER — Ambulatory Visit: Admitting: Cardiology

## 2024-07-08 ENCOUNTER — Ambulatory Visit
